# Patient Record
Sex: Female | Born: 1995 | Race: Black or African American | Hispanic: No | Marital: Married | Smoking: Never smoker
Health system: Southern US, Community
[De-identification: ages and names within clinical notes are randomized; demographics above are authoritative.]

## PROBLEM LIST (undated history)

## (undated) ENCOUNTER — Inpatient Hospital Stay (HOSPITAL_COMMUNITY): Payer: Self-pay

## (undated) DIAGNOSIS — B3731 Acute candidiasis of vulva and vagina: Secondary | ICD-10-CM

## (undated) DIAGNOSIS — A749 Chlamydial infection, unspecified: Secondary | ICD-10-CM

## (undated) DIAGNOSIS — A549 Gonococcal infection, unspecified: Secondary | ICD-10-CM

## (undated) DIAGNOSIS — N390 Urinary tract infection, site not specified: Secondary | ICD-10-CM

## (undated) DIAGNOSIS — N189 Chronic kidney disease, unspecified: Secondary | ICD-10-CM

## (undated) DIAGNOSIS — B373 Candidiasis of vulva and vagina: Secondary | ICD-10-CM

## (undated) HISTORY — PX: NO PAST SURGERIES: SHX2092

---
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
## 2013-01-01 ENCOUNTER — Encounter: Payer: Self-pay | Admitting: Family Medicine

## 2013-02-08 ENCOUNTER — Emergency Department (HOSPITAL_COMMUNITY)
Admission: EM | Admit: 2013-02-08 | Discharge: 2013-02-08 | Disposition: A | Discharge status: Home / Self Care | Payer: No Typology Code available for payment source | Attending: Emergency Medicine | Admitting: Emergency Medicine

## 2013-02-08 ENCOUNTER — Encounter (HOSPITAL_COMMUNITY): Payer: Self-pay | Admitting: Emergency Medicine

## 2013-02-08 DIAGNOSIS — M549 Dorsalgia, unspecified: Secondary | ICD-10-CM | POA: Insufficient documentation

## 2013-02-08 DIAGNOSIS — Z3202 Encounter for pregnancy test, result negative: Secondary | ICD-10-CM | POA: Insufficient documentation

## 2013-02-08 DIAGNOSIS — N760 Acute vaginitis: Secondary | ICD-10-CM | POA: Insufficient documentation

## 2013-02-08 DIAGNOSIS — N390 Urinary tract infection, site not specified: Secondary | ICD-10-CM | POA: Insufficient documentation

## 2013-02-08 DIAGNOSIS — R509 Fever, unspecified: Secondary | ICD-10-CM | POA: Insufficient documentation

## 2013-02-08 DIAGNOSIS — A499 Bacterial infection, unspecified: Secondary | ICD-10-CM | POA: Insufficient documentation

## 2013-02-08 DIAGNOSIS — B9689 Other specified bacterial agents as the cause of diseases classified elsewhere: Secondary | ICD-10-CM | POA: Insufficient documentation

## 2013-02-08 LAB — URINALYSIS, ROUTINE W REFLEX MICROSCOPIC
Protein, ur: NEGATIVE mg/dL
Urobilinogen, UA: 2 mg/dL — ABNORMAL HIGH (ref 0.0–1.0)

## 2013-02-08 LAB — URINE MICROSCOPIC-ADD ON

## 2013-02-08 LAB — WET PREP, GENITAL: Trich, Wet Prep: NONE SEEN

## 2013-02-08 MED ORDER — NITROFURANTOIN MONOHYD MACRO 100 MG PO CAPS
100.0000 mg | ORAL_CAPSULE | Freq: Once | ORAL | Status: AC
  Administered 2013-02-08: 100 mg via ORAL
  Filled 2013-02-08: qty 1

## 2013-02-08 MED ORDER — METRONIDAZOLE 0.75 % VA GEL: 1.0000 | Freq: Every day | VAGINAL | Status: DC

## 2013-02-08 MED ORDER — NITROFURANTOIN MONOHYD MACRO 100 MG PO CAPS: 100.0000 mg | ORAL_CAPSULE | Freq: Two times a day (BID) | ORAL | Status: DC

## 2013-02-08 NOTE — ED Notes (Signed)
Pt also c/o vaginal discharge with sex.

## 2013-02-08 NOTE — ED Provider Notes (Signed)
CSN: [id number]     Arrival date & time 02/08/13  1200 History   First MD Initiated Contact with Patient 02/08/13 1405     Chief Complaint  Patient presents with  . Urinary Frequency  . Dysuria  . Vaginal Discharge   (Consider location/radiation/quality/duration/timing/severity/associated sxs/prior Treatment) HPI Comments: Patient presents with complaint of vaginal discharge for several weeks, dysuria for the past day with associated increased in frequency. Patient will treatment for yeast infection this morning but has not started using. She has had subjective fever. No vomiting. Occasional back pain. No diarrhea or abdominal pain. Onset of symptoms gradual. Course is constant. Nothing makes symptoms better or worse. She is sexually active and uses protection. She does not think that she has a STD.  Patient is a 17 y.o. female presenting with frequency, dysuria, and vaginal discharge. The history is provided by the patient.  Urinary Frequency Associated symptoms include a fever (subjective). Pertinent negatives include no abdominal pain, chest pain, coughing, headaches, myalgias, nausea, rash, sore throat or vomiting.  Dysuria Associated symptoms: fever (subjective) and vaginal discharge   Associated symptoms: no abdominal pain, no nausea and no vomiting   Vaginal Discharge Associated symptoms: dysuria and fever (subjective)   Associated symptoms: no abdominal pain, no nausea and no vomiting     Past Medical History  Diagnosis Date  . GC (gonococcus infection)   . Chlamydia   . Yeast vaginitis    History reviewed. No pertinent past surgical history. No family history on file. History  Substance Use Topics  . Smoking status: Never Smoker   . Smokeless tobacco: Never Used  . Alcohol Use: No   OB History   Grav Para Term Preterm Abortions TAB SAB Ect Mult Living                 Review of Systems  Constitutional: Positive for fever (subjective).  HENT: Negative for  rhinorrhea and sore throat.   Eyes: Negative for redness.  Respiratory: Negative for cough.   Cardiovascular: Negative for chest pain.  Gastrointestinal: Negative for nausea, vomiting, abdominal pain and diarrhea.  Genitourinary: Positive for dysuria, frequency and vaginal discharge.  Musculoskeletal: Positive for back pain. Negative for myalgias.  Skin: Negative for rash.  Neurological: Negative for headaches.    Allergies  Flagyl  Home Medications   Current Outpatient Rx  Name  Route  Sig  Dispense  Refill  . miconazole (MONISTAT 7) 2 % vaginal cream   Vaginal   Place 1 Applicatorful vaginally daily.          BP 116/78  Pulse 103  Temp(Src) 100 F (37.8 C) (Oral)  Resp 18  Wt 119 lb (53.978 kg)  SpO2 100% Physical Exam  Nursing note and vitals reviewed. Constitutional: She appears well-developed and well-nourished.  HENT:  Head: Normocephalic and atraumatic.  Eyes: Conjunctivae are normal. Right eye exhibits no discharge. Left eye exhibits no discharge.  Neck: Normal range of motion. Neck supple.  Cardiovascular: Normal rate, regular rhythm and normal heart sounds.   Pulmonary/Chest: Effort normal and breath sounds normal.  Abdominal: Soft. There is tenderness (mild) in the suprapubic area. There is no rigidity, no rebound, no guarding, no CVA tenderness, no tenderness at McBurney's point and negative Murphy's sign.  Genitourinary: Cervix exhibits no motion tenderness, no discharge and no friability. Right adnexum displays no mass, no tenderness and no fullness. Left adnexum displays no mass, no tenderness and no fullness. No erythema, tenderness or bleeding around the vagina. Vaginal discharge (  thick white) found.  Neurological: She is alert.  Skin: Skin is warm and dry.  Psychiatric: She has a normal mood and affect.    ED Course  Procedures (including critical care time) Labs Review Labs Reviewed  WET PREP, GENITAL - Abnormal; Notable for the following:     Clue Cells Wet Prep HPF POC FEW (*)    WBC, Wet Prep HPF POC FEW (*)    All other components within normal limits  URINALYSIS, ROUTINE W REFLEX MICROSCOPIC - Abnormal; Notable for the following:    APPearance TURBID (*)    Hgb urine dipstick LARGE (*)    Bilirubin Urine SMALL (*)    Ketones, ur 40 (*)    Urobilinogen, UA 2.0 (*)    Leukocytes, UA LARGE (*)    All other components within normal limits  URINE MICROSCOPIC-ADD ON - Abnormal; Notable for the following:    Squamous Epithelial / LPF FEW (*)    Bacteria, UA MANY (*)    All other components within normal limits  URINE CULTURE  GC/CHLAMYDIA PROBE AMP  POCT PREGNANCY, URINE   Imaging Review No results found.  EKG Interpretation   None      2:20 PM Patient seen and examined. Work-up initiated. Medications ordered.   Vital signs reviewed and are as follows: Filed Vitals:   02/08/13 1404  BP: 116/78  Pulse: 103  Temp: 100 F (37.8 C)  Resp: 18   Pelvic performed with nurse chaperone.   Pt informed of all results.   Patient urged to return with worsening symptoms or other concerns. Patient verbalized understanding and agrees with plan.     MDM   1. Urinary tract infection   2. Bacterial vaginosis    Dysuria: uncomplicated cystitis, macrobid given. No clinical pyelo. Neg UPT.   Vaginal d/c: No yeast, mild clue cells. Will treat for BV. Test for GC/chlamydia sent. Pt refuses empiric treatment.     Renne Crigler, PA-C 02/08/13 1945

## 2013-02-08 NOTE — ED Notes (Signed)
Pt states she woke this morning and had burning/frequent urination.  Pt also states she has white vaginal discharge.

## 2013-02-10 ENCOUNTER — Telehealth (HOSPITAL_COMMUNITY): Payer: Self-pay | Admitting: *Deleted

## 2013-02-10 LAB — URINE CULTURE: Colony Count: >=100000

## 2013-02-10 NOTE — Progress Notes (Signed)
ED Antimicrobial Stewardship Positive Culture Follow Up   Ethelean Faughnan is an 17 y.o. female who presented to [facility] on 02/08/2013 with a chief complaint of  Chief Complaint  Patient presents with  . Urinary Frequency  . Dysuria  . Vaginal Discharge    Recent Results (from the past 720 hour(s))  URINE CULTURE     Status: None   Collection Time    02/08/13 12:57 PM      Result Value Range Status   Specimen Description URINE, CLEAN CATCH   Final   Special Requests NONE   Final   Culture  Setup Time     Final   Value: 02/08/2013 17:13     Performed at [facility] Count     Final   Value: >=100,000 COLONIES/ML     Performed at [facility]   Culture     Final   Value: PROTEUS MIRABILIS     Performed at [facility]   Report Status 02/10/2013 FINAL   Final   Organism ID, Bacteria PROTEUS MIRABILIS   Final  GC/CHLAMYDIA PROBE AMP     Status: None   Collection Time    02/08/13  3:02 PM      Result Value Range Status   CT Probe RNA NEGATIVE  NEGATIVE Final   GC Probe RNA NEGATIVE  NEGATIVE Final   Comment: (NOTE)                                                                                               **Normal Reference Range: Negative**          Assay performed using the Gen-Probe APTIMA COMBO2 (R) Assay.     Acceptable specimen types for this assay include APTIMA Swabs (Unisex,     endocervical, urethral, or vaginal), first void urine, and ThinPrep     liquid based cytology samples.     Performed at [facility]  WET PREP, GENITAL     Status: Abnormal   Collection Time    02/08/13  3:03 PM      Result Value Range Status   Yeast Wet Prep HPF POC NONE SEEN  NONE SEEN Corrected   Comment: CALLEDY BY DR Fayrene Fearing @ 1547 02/08/13 A BROWNING     CORRECTED ON 12/29 AT 1946: PREVIOUSLY REPORTED AS NO CHARGE   Trich, Wet Prep NONE SEEN  NONE SEEN Final   Clue Cells Wet Prep HPF POC FEW (*) NONE SEEN Final   WBC, Wet Prep HPF POC FEW  (*) NONE SEEN Final    [x]  Treated with nitrofurantoin, organism resistant to prescribed antimicrobial []  Patient discharged originally without antimicrobial agent and treatment is now indicated  New antibiotic prescription: Bactrim DS 1 tablet BID x 3 days  ED Provider: Francee Piccolo, PA-C   Mickeal Skinner 02/10/2013, 9:54 AM Infectious Diseases Pharmacist Phone# [phone]

## 2013-02-10 NOTE — ED Provider Notes (Signed)
Medical screening examination/treatment/procedure(s) were performed by non-physician practitioner and as supervising physician I was immediately available for consultation/collaboration.  EKG Interpretation   None         Rolland Porter, MD 02/10/13 [phone]

## 2013-02-10 NOTE — ED Notes (Signed)
Post ED Visit - Positive Culture Follow-up: Successful Patient Follow-Up  Culture assessed and recommendations reviewed by: []  Wes Dulaney, Pharm.D., BCPS [x]  Celedonio Miyamoto, Pharm.D., BCPS []  Georgina Pillion, [address].D., BCPS []  [place], [address].D., BCPS, AAHIVP []  Estella Husk, Pharm.D., BCPS, AAHIVP  Positive urine culture [X]  Treated with nitrofurantoin, organism resistant to prescribed antimicrobial  [ ]  Patient discharged originally without antimicrobial agent and treatment is now indicated  New antibiotic prescription: Bactrim DS 1 tablet BID x 3 days  ED Provider: Francee Piccolo, PA-C       Larena Sox 02/10/2013, 1:57 PM

## 2013-02-11 NOTE — L&D Delivery Note (Signed)
Delivery Note At 10:36 AM a viable and healthy female was delivered via Vaginal, Spontaneous Delivery (Presentation: Right Occiput Anterior).  APGAR: 9, 9; weight 7 lb 10.9 oz (3485 g).   Placenta status: Intact, Spontaneous.  Cord: 3 vessels with the following complications: None.    Anesthesia: Epidural  Episiotomy: None Lacerations: 2nd degree Suture Repair: 3.0 vicryl Est. Blood Loss (mL): 350  Mom to postpartum.  Baby to Couplet care / Skin to Skin.  Robin Craig, Robin Safley L  MD, PGY1 01/20/2014, 11:43 AM

## 2013-08-05 LAB — OB RESULTS CONSOLE GBS: GBS: POSITIVE

## 2013-08-05 LAB — OB RESULTS CONSOLE GC/CHLAMYDIA
Chlamydia: NEGATIVE
GC PROBE AMP, GENITAL: NEGATIVE

## 2013-08-05 LAB — OB RESULTS CONSOLE RUBELLA ANTIBODY, IGM: Rubella: IMMUNE

## 2013-08-05 LAB — OB RESULTS CONSOLE HEPATITIS B SURFACE ANTIGEN: HEP B S AG: NEGATIVE

## 2013-08-05 LAB — OB RESULTS CONSOLE HIV ANTIBODY (ROUTINE TESTING): HIV: NONREACTIVE

## 2013-08-05 LAB — OB RESULTS CONSOLE RPR: RPR: NONREACTIVE

## 2014-01-19 ENCOUNTER — Encounter (HOSPITAL_COMMUNITY): Payer: Self-pay | Admitting: *Deleted

## 2014-01-19 ENCOUNTER — Inpatient Hospital Stay (HOSPITAL_COMMUNITY): Admitting: Anesthesiology

## 2014-01-19 ENCOUNTER — Inpatient Hospital Stay (HOSPITAL_COMMUNITY)

## 2014-01-19 ENCOUNTER — Inpatient Hospital Stay (HOSPITAL_COMMUNITY)
Admission: AD | Admit: 2014-01-19 | Discharge: 2014-01-22 | DRG: 774 | Disposition: A | Discharge status: Home / Self Care | Source: Ambulatory Visit | Attending: Obstetrics & Gynecology | Admitting: Obstetrics & Gynecology

## 2014-01-19 DIAGNOSIS — Z3A39 39 weeks gestation of pregnancy: Secondary | ICD-10-CM | POA: Diagnosis present

## 2014-01-19 DIAGNOSIS — O99824 Streptococcus B carrier state complicating childbirth: Secondary | ICD-10-CM | POA: Diagnosis present

## 2014-01-19 DIAGNOSIS — IMO0002 Reserved for concepts with insufficient information to code with codable children: Secondary | ICD-10-CM

## 2014-01-19 LAB — DIFFERENTIAL
Basophils Absolute: 0 10*3/uL (ref 0.0–0.1)
Basophils Relative: 0 % (ref 0–1)
Eosinophils Absolute: 0 10*3/uL (ref 0.0–0.7)
Eosinophils Relative: 0 % (ref 0–5)
LYMPHS ABS: 2 10*3/uL (ref 0.7–4.0)
LYMPHS PCT: 13 % (ref 12–46)
MONO ABS: 1.7 10*3/uL — AB (ref 0.1–1.0)
Monocytes Relative: 11 % (ref 3–12)
NEUTROS ABS: 11.3 10*3/uL — AB (ref 1.7–7.7)
Neutrophils Relative %: 76 % (ref 43–77)

## 2014-01-19 LAB — CBC
HEMATOCRIT: 35.2 % — AB (ref 36.0–46.0)
HEMOGLOBIN: 11.7 g/dL — AB (ref 12.0–15.0)
MCH: 28.5 pg (ref 26.0–34.0)
MCHC: 33.2 g/dL (ref 30.0–36.0)
MCV: 85.6 fL (ref 78.0–100.0)
Platelets: 218 10*3/uL (ref 150–400)
RBC: 4.11 MIL/uL (ref 3.87–5.11)
RDW: 15.1 % (ref 11.5–15.5)
WBC: 15.1 10*3/uL — AB (ref 4.0–10.5)

## 2014-01-19 LAB — TYPE AND SCREEN
ABO/RH(D): B POS
Antibody Screen: NEGATIVE

## 2014-01-19 MED ORDER — FENTANYL CITRATE 0.05 MG/ML IJ SOLN: 50.0000 ug | INTRAMUSCULAR | Status: DC | PRN

## 2014-01-19 MED ORDER — EPHEDRINE 5 MG/ML INJ
10.0000 mg | INTRAVENOUS | Status: DC | PRN
  Filled 2014-01-19: qty 2

## 2014-01-19 MED ORDER — FENTANYL CITRATE 0.05 MG/ML IJ SOLN
100.0000 ug | INTRAMUSCULAR | Status: DC | PRN
  Administered 2014-01-19: 100 ug via INTRAVENOUS
  Filled 2014-01-19: qty 2

## 2014-01-19 MED ORDER — LIDOCAINE HCL (PF) 1 % IJ SOLN
INTRAMUSCULAR | Status: DC | PRN
  Administered 2014-01-19: 7 mL
  Administered 2014-01-19: 9 mL

## 2014-01-19 MED ORDER — OXYCODONE-ACETAMINOPHEN 5-325 MG PO TABS: 2.0000 | ORAL_TABLET | ORAL | Status: DC | PRN

## 2014-01-19 MED ORDER — FENTANYL 2.5 MCG/ML BUPIVACAINE 1/10 % EPIDURAL INFUSION (WH - ANES)
INTRAMUSCULAR | Status: DC | PRN
  Administered 2014-01-19: 14 mL/h via EPIDURAL

## 2014-01-19 MED ORDER — FLEET ENEMA 7-19 GM/118ML RE ENEM
1.0000 | ENEMA | RECTAL | Status: DC | PRN
Start: 2014-01-19 — End: 2014-01-20

## 2014-01-19 MED ORDER — OXYCODONE-ACETAMINOPHEN 5-325 MG PO TABS: 1.0000 | ORAL_TABLET | ORAL | Status: DC | PRN

## 2014-01-19 MED ORDER — OXYTOCIN 40 UNITS IN LACTATED RINGERS INFUSION - SIMPLE MED
62.5000 mL/h | INTRAVENOUS | Status: DC
  Filled 2014-01-19: qty 1000

## 2014-01-19 MED ORDER — LACTATED RINGERS IV SOLN
500.0000 mL | INTRAVENOUS | Status: DC | PRN
  Administered 2014-01-20: 500 mL via INTRAVENOUS

## 2014-01-19 MED ORDER — PHENYLEPHRINE 40 MCG/ML (10ML) SYRINGE FOR IV PUSH (FOR BLOOD PRESSURE SUPPORT)
80.0000 ug | PREFILLED_SYRINGE | INTRAVENOUS | Status: DC | PRN
  Filled 2014-01-19: qty 2

## 2014-01-19 MED ORDER — LACTATED RINGERS IV SOLN
INTRAVENOUS | Status: DC
  Administered 2014-01-19: 22:00:00 via INTRAVENOUS
  Administered 2014-01-20: 125 mL/h via INTRAVENOUS

## 2014-01-19 MED ORDER — CITRIC ACID-SODIUM CITRATE 334-500 MG/5ML PO SOLN: 30.0000 mL | ORAL | Status: DC | PRN

## 2014-01-19 MED ORDER — FENTANYL 2.5 MCG/ML BUPIVACAINE 1/10 % EPIDURAL INFUSION (WH - ANES)
14.0000 mL/h | INTRAMUSCULAR | Status: DC | PRN
  Administered 2014-01-19 – 2014-01-20 (×2): 14 mL/h via EPIDURAL
  Filled 2014-01-19 (×2): qty 125

## 2014-01-19 MED ORDER — PENICILLIN G POTASSIUM 5000000 UNITS IJ SOLR
2.5000 10*6.[IU] | INTRAVENOUS | Status: DC
  Administered 2014-01-20 (×3): 2.5 10*6.[IU] via INTRAVENOUS
  Filled 2014-01-19 (×5): qty 2.5

## 2014-01-19 MED ORDER — ACETAMINOPHEN 325 MG PO TABS
650.0000 mg | ORAL_TABLET | ORAL | Status: DC | PRN
  Administered 2014-01-20: 650 mg via ORAL
  Filled 2014-01-19: qty 2

## 2014-01-19 MED ORDER — DIPHENHYDRAMINE HCL 50 MG/ML IJ SOLN: 12.5000 mg | INTRAMUSCULAR | Status: DC | PRN

## 2014-01-19 MED ORDER — LACTATED RINGERS IV SOLN
500.0000 mL | Freq: Once | INTRAVENOUS | Status: AC
  Administered 2014-01-19: 500 mL via INTRAVENOUS

## 2014-01-19 MED ORDER — PHENYLEPHRINE 40 MCG/ML (10ML) SYRINGE FOR IV PUSH (FOR BLOOD PRESSURE SUPPORT)
80.0000 ug | PREFILLED_SYRINGE | INTRAVENOUS | Status: DC | PRN
  Filled 2014-01-19: qty 10
  Filled 2014-01-19: qty 2

## 2014-01-19 MED ORDER — ONDANSETRON HCL 4 MG/2ML IJ SOLN
4.0000 mg | Freq: Four times a day (QID) | INTRAMUSCULAR | Status: DC | PRN
Start: 2014-01-19 — End: 2014-01-20
  Administered 2014-01-20: 4 mg via INTRAVENOUS
  Filled 2014-01-19: qty 2

## 2014-01-19 MED ORDER — PENICILLIN G POTASSIUM 5000000 UNITS IJ SOLR
5.0000 10*6.[IU] | Freq: Once | INTRAMUSCULAR | Status: AC
  Administered 2014-01-19: 5 10*6.[IU] via INTRAVENOUS
  Filled 2014-01-19: qty 5

## 2014-01-19 MED ORDER — OXYTOCIN BOLUS FROM INFUSION: 500.0000 mL | INTRAVENOUS | Status: DC

## 2014-01-19 MED ORDER — LIDOCAINE HCL (PF) 1 % IJ SOLN
30.0000 mL | INTRAMUSCULAR | Status: DC | PRN
  Filled 2014-01-19: qty 30

## 2014-01-19 NOTE — H&P (Signed)
Robin Craig is a 18 y.o. female G1P0 at 4054w1d by unknown dating criteria as prenatal records not available presenting for symptoms of labor.  [place] in [place] and denies any pregnancy complications. Planned to deliver here as her FOB just got deployed and [place]. Reports frequent contractions (every 3-5 minutes), possible LOF, no vaginal bleeding. Good fetal movement.   Maternal Medical History:  Reason for admission: Nausea.    OB History    Gravida Para Term Preterm AB TAB SAB Ectopic Multiple Living   1              Past Medical History  Diagnosis Date  . GC (gonococcus infection)   . Chlamydia   . Yeast vaginitis    Past Surgical History  Procedure Laterality Date  . No past surgeries    . Wisdom tooth extraction  2010   Family History: family history is negative for Cancer, Diabetes, and Hypertension. Social History:  reports that she has never smoked. She has never used smokeless tobacco. She reports that she does not drink alcohol or use illicit drugs.    Review of Systems  Constitutional: Negative for fever, chills and weight loss.  Eyes: Negative for blurred vision.  Respiratory: Negative for cough and shortness of breath.   Cardiovascular: Negative for chest pain and leg swelling.  Gastrointestinal: Negative for heartburn, nausea, vomiting and abdominal pain.  Genitourinary: Negative for dysuria.  Musculoskeletal: Positive for back pain.  Skin: Negative for itching.  Neurological: Negative for headaches.     1900 Patient checked in triage room 1.5/100/-2/BBOW/cephlaic (verified on ultrasound).  Dilation: 3 Effacement (%): 90 Station: -2 Exam by:: Restaurant manager, fast foodDunbar RN  Blood pressure 106/62, pulse 80, temperature 98.6 F (37 C), temperature source Oral, resp. rate 18, height 5\' 5"  (1.651 m), weight 70.308 kg (155 lb), SpO2 92 %. Maternal Exam:  Uterine Assessment: Contraction strength is moderate.  Contraction frequency is  irregular.   Pelvis: adequate for delivery.   Cervix: Cervix evaluated by digital exam.     Fetal Exam Fetal Monitor Review: Baseline rate: 135.  Variability: moderate (6-25 bpm).   Pattern: accelerations present and no decelerations.    Fetal State Assessment: Category I - tracings are normal.    Fetal heart monitoring: noted to have recurrent variables in MAU with good variability and accels present in between   Physical Exam  Constitutional: She is oriented to person, place, and time. She appears well-developed and well-nourished. No distress.  HENT:  Head: Normocephalic and atraumatic.  Cardiovascular: Normal rate and intact distal pulses.   Respiratory: Effort normal. No respiratory distress.  Musculoskeletal: She exhibits no edema or tenderness.  Neurological: She is alert and oriented to person, place, and time.  Skin: Skin is warm and dry.    Prenatal labs: ABO, Rh: --/--/B POS (12/09 2001) Antibody: NEG (12/09 2001) Rubella:  pending RPR:   pending HBsAg:   pending HIV:   pending GBS:   positive per patient report, PCR pending  Assessment/Plan: Robin Craig is a 18 y.o. G1P0 at 4654w1d here for symptoms of labor. Cervix change from 1.5 to 3cm dilation within 1 hour.    #Labor: expectant management currently. May require augmentation with pitocin later #Pain: IV Fentanyl, epidural upon request #FWB: Cat 1 currently.  Had recurrent variables in MAU, given cervical change will cancel BPP and proceed with admission to L&D. #ID:  GBS pos per patient report - PCN. Repeat PCR pending #MOF: bottle #MOC:  uncertain #Circ:  Desires IP circ    Shirlee LatchBacigalupo, Angela 01/19/2014, 10:20 PM    Attestation of Attending Supervision of Resident: Evaluation and management procedures were performed by the [facility] Resident under my supervision.  I have seen and examined the patient, reviewed the resident's note and chart, and I agree with the management and plan.  Jaynie CollinsUGONNA   Sunni Richardson, MD, FACOG Attending Obstetrician & Gynecologist Faculty Practice, [facility]

## 2014-01-19 NOTE — MAU Note (Signed)
Pt in room 172, Labor nurse in room. Pt ambulating to BR, will apply monitors.

## 2014-01-19 NOTE — MAU Note (Signed)
Started having uc's @ 0500 yesterday, [place] husband is [place].  Denies bleeding or LOF.  Feels need to push.

## 2014-01-19 NOTE — MAU Provider Note (Signed)
Obstetric Attending MAU Note  Chief Complaint:  Labor Eval   None    HPI: Robin Craig is a 18 y.o. G1P0 at 6453w1d who presents to maternity admissions reporting feeling urge to push. [place] in [place] and denies any pregnancy complications.  Planned to deliver here as her FOB just got deployed and [place] in [place].  Reports frequent contractions (every 3-5 minutes), possible LOF, no vaginal bleeding. Good fetal movement.   Past Medical History  Diagnosis Date  . GC (gonococcus infection)   . Chlamydia   . Yeast vaginitis     OB History  Gravida Para Term Preterm AB SAB TAB Ectopic Multiple Living  1             # Outcome Date GA Lbr Len/2nd Weight Sex Delivery Anes PTL Lv  1 Current               No past surgical history on file.  Family History: No family history on file.  Social History: History  Substance Use Topics  . Smoking status: Never Smoker   . Smokeless tobacco: Never Used  . Alcohol Use: No    Allergies:  Allergies  Allergen Reactions  . Flagyl [Metronidazole] Hives    Tablets only; can use cream    Prescriptions prior to admission  Medication Sig Dispense Refill Last Dose  . metroNIDAZOLE (METROGEL) 0.75 % vaginal gel Place 1 Applicatorful vaginally at bedtime. Use for 5 days. 70 g 0   . miconazole (MONISTAT 7) 2 % vaginal cream Place 1 Applicatorful vaginally daily.   02/07/2013 at Unknown time  . nitrofurantoin, macrocrystal-monohydrate, (MACROBID) 100 MG capsule Take 1 capsule (100 mg total) by mouth 2 (two) times daily. 10 capsule 0     ROS: Pertinent findings in history of present illness.  Physical Exam  Blood pressure 118/89, pulse 92, temperature 98.3 F (36.8 C), temperature source Oral, resp. rate 20. GENERAL: Well-developed, well-nourished female in no acute distress.  HEENT: Normocephalic, atraumatic HEART: Regular rate and regular RESP: Normal effort, no breathing difficult ABDOMEN: Soft, non-tender,  gravid appropriate for gestational age EXTREMITIES: Nontender, no edema NEURO: Alert and oriented SPECULUM EXAM: NEFG, physiologic discharge, no blood, cervix clean Dilation: 1.5 Effacement (%): 100 Presentation: Vertex (Verified by sonosite U/S) Exam by:: Dr. Macon LargeAnyanwu at 1900  FHT:  Baseline 160 , moderate variability, accelerations present, no decelerations Contractions: q 3-4 mins   Labs: No results found for this or any previous visit (from the past 24 hour(s)).  Imaging:  No results found.  MAU Course: 1900 Patient checked in triage room 1.5/100/-2/BBOW/cephlaic (verified on ultrasound). Will observe and reevaluate in about one hour. Care Everywhere checked, no prenatal labs seen.  Prenatal labs ordered including rapid GBS.  Fentanyl IM ordered prn pain.  Assessment: Likely early labor  Plan: Will follow up to see if she makes cervical change, continue close monitoring.    Tereso NewcomerUgonna A Dylynn Ketner, MD 01/19/2014 7:35 PM

## 2014-01-19 NOTE — MAU Note (Signed)
Pt care assumed from [place], [place]

## 2014-01-19 NOTE — Anesthesia Preprocedure Evaluation (Signed)
Anesthesia Evaluation  Patient identified by MRN, date of birth, ID band Patient awake    Reviewed: Allergy & Precautions, H&P , NPO status , Patient's Chart, lab work & pertinent test results  Airway Mallampati: I TM Distance: >3 FB Neck ROM: full    Dental no notable dental hx.    Pulmonary    Pulmonary exam normal       Cardiovascular negative cardio ROS      Neuro/Psych negative neurological ROS  negative psych ROS   GI/Hepatic negative GI ROS, Neg liver ROS,   Endo/Other  negative endocrine ROS  Renal/GU negative Renal ROS     Musculoskeletal   Abdominal Normal abdominal exam  (+)   Peds  Hematology negative hematology ROS (+)   Anesthesia Other Findings   Reproductive/Obstetrics (+) Pregnancy                           Anesthesia Physical Anesthesia Plan  ASA: II  Anesthesia Plan: Epidural   Post-op Pain Management:    Induction:   Airway Management Planned:   Additional Equipment:   Intra-op Plan:   Post-operative Plan:   Informed Consent: I have reviewed the patients History and Physical, chart, labs and discussed the procedure including the risks, benefits and alternatives for the proposed anesthesia with the patient or authorized representative who has indicated his/her understanding and acceptance.     Plan Discussed with:   Anesthesia Plan Comments:         Anesthesia Quick Evaluation  

## 2014-01-19 NOTE — Anesthesia Procedure Notes (Signed)
Epidural Patient location during procedure: OB Start time: 01/19/2014 10:03 PM End time: 01/19/2014 10:07 PM  Staffing Anesthesiologist: Leilani AbleHATCHETT, Noemi Bellissimo  Preanesthetic Checklist Completed: patient identified, surgical consent, pre-op evaluation, timeout performed, IV checked, risks and benefits discussed and monitors and equipment checked  Epidural Patient position: sitting Prep: site prepped and draped and DuraPrep Patient monitoring: continuous pulse ox and blood pressure Approach: midline Location: L3-L4 Injection technique: LOR air  Needle:  Needle type: Tuohy  Needle gauge: 17 G Needle length: 9 cm and 9 Needle insertion depth: 5 cm cm Catheter type: closed end flexible Catheter size: 19 Gauge Catheter at skin depth: 10 cm Test dose: negative and Other  Assessment Sensory level: T9 Events: blood not aspirated, injection not painful, no injection resistance, negative IV test and no paresthesia  Additional Notes Reason for block:procedure for pain

## 2014-01-20 ENCOUNTER — Encounter (HOSPITAL_COMMUNITY): Payer: Self-pay | Admitting: *Deleted

## 2014-01-20 LAB — HEPATITIS B SURFACE ANTIGEN: Hepatitis B Surface Ag: NEGATIVE

## 2014-01-20 LAB — RUBELLA SCREEN: Rubella: 3.23 Index — ABNORMAL HIGH (ref <0.90)

## 2014-01-20 LAB — HIV ANTIBODY (ROUTINE TESTING W REFLEX): HIV: NONREACTIVE

## 2014-01-20 LAB — ABO/RH: ABO/RH(D): B POS

## 2014-01-20 LAB — RPR

## 2014-01-20 MED ORDER — OXYCODONE-ACETAMINOPHEN 5-325 MG PO TABS: 1.0000 | ORAL_TABLET | ORAL | Status: DC | PRN

## 2014-01-20 MED ORDER — SENNOSIDES-DOCUSATE SODIUM 8.6-50 MG PO TABS
2.0000 | ORAL_TABLET | ORAL | Status: DC
Start: 2014-01-21 — End: 2014-01-22
  Administered 2014-01-21 – 2014-01-22 (×2): 2 via ORAL
  Filled 2014-01-20 (×2): qty 2

## 2014-01-20 MED ORDER — WITCH HAZEL-GLYCERIN EX PADS: 1.0000 "application " | MEDICATED_PAD | CUTANEOUS | Status: DC | PRN

## 2014-01-20 MED ORDER — DIBUCAINE 1 % RE OINT: 1.0000 "application " | TOPICAL_OINTMENT | RECTAL | Status: DC | PRN

## 2014-01-20 MED ORDER — BENZOCAINE-MENTHOL 20-0.5 % EX AERO: 1.0000 "application " | INHALATION_SPRAY | CUTANEOUS | Status: DC | PRN

## 2014-01-20 MED ORDER — TERBUTALINE SULFATE 1 MG/ML IJ SOLN
INTRAMUSCULAR | Status: AC
  Administered 2014-01-20: 0.25 mg via SUBCUTANEOUS
  Filled 2014-01-20: qty 1

## 2014-01-20 MED ORDER — LANOLIN HYDROUS EX OINT: TOPICAL_OINTMENT | CUTANEOUS | Status: DC | PRN

## 2014-01-20 MED ORDER — ONDANSETRON HCL 4 MG/2ML IJ SOLN: 4.0000 mg | INTRAMUSCULAR | Status: DC | PRN

## 2014-01-20 MED ORDER — ONDANSETRON HCL 4 MG PO TABS: 4.0000 mg | ORAL_TABLET | ORAL | Status: DC | PRN

## 2014-01-20 MED ORDER — TETANUS-DIPHTH-ACELL PERTUSSIS 5-2.5-18.5 LF-MCG/0.5 IM SUSP: 0.5000 mL | Freq: Once | INTRAMUSCULAR | Status: DC

## 2014-01-20 MED ORDER — ZOLPIDEM TARTRATE 5 MG PO TABS: 5.0000 mg | ORAL_TABLET | Freq: Every evening | ORAL | Status: DC | PRN

## 2014-01-20 MED ORDER — TERBUTALINE SULFATE 1 MG/ML IJ SOLN
0.2500 mg | Freq: Once | INTRAMUSCULAR | Status: AC
  Administered 2014-01-20: 0.25 mg via SUBCUTANEOUS

## 2014-01-20 MED ORDER — IBUPROFEN 600 MG PO TABS
600.0000 mg | ORAL_TABLET | Freq: Four times a day (QID) | ORAL | Status: DC
  Administered 2014-01-20 – 2014-01-22 (×8): 600 mg via ORAL
  Filled 2014-01-20 (×8): qty 1

## 2014-01-20 MED ORDER — INFLUENZA VAC SPLIT QUAD 0.5 ML IM SUSY: 0.5000 mL | PREFILLED_SYRINGE | INTRAMUSCULAR | Status: DC

## 2014-01-20 MED ORDER — OXYCODONE-ACETAMINOPHEN 5-325 MG PO TABS: 2.0000 | ORAL_TABLET | ORAL | Status: DC | PRN

## 2014-01-20 MED ORDER — SIMETHICONE 80 MG PO CHEW: 80.0000 mg | CHEWABLE_TABLET | ORAL | Status: DC | PRN

## 2014-01-20 MED ORDER — PRENATAL MULTIVITAMIN CH
1.0000 | ORAL_TABLET | Freq: Every day | ORAL | Status: DC
  Administered 2014-01-21: 1 via ORAL
  Filled 2014-01-20: qty 1

## 2014-01-20 MED ORDER — DIPHENHYDRAMINE HCL 25 MG PO CAPS: 25.0000 mg | ORAL_CAPSULE | Freq: Four times a day (QID) | ORAL | Status: DC | PRN

## 2014-01-20 NOTE — Anesthesia Postprocedure Evaluation (Signed)
  Anesthesia Post-op Note  Patient: Robin Craig  Procedure(s) Performed: * No procedures listed *  Patient Location: Mother/Baby  Anesthesia Type:Epidural  Level of Consciousness: awake  Airway and Oxygen Therapy: Patient Spontanous Breathing  Post-op Pain: mild  Post-op Assessment: Patient's Cardiovascular Status Stable and Respiratory Function Stable  Post-op Vital Signs: stable  Last Vitals:  Filed Vitals:   01/20/14 1357  BP: 109/66  Pulse: 84  Temp:   Resp: 20    Complications: No apparent anesthesia complications

## 2014-01-20 NOTE — Plan of Care (Signed)
Problem: Phase I Progression Outcomes Goal: VS, stable, temp < 100.4 degrees F Outcome: Completed/Met Date Met:  01/20/14 Goal: Initial discharge plan identified Outcome: Completed/Met Date Met:  01/20/14  Problem: Phase II Progression Outcomes Goal: Tolerating diet Outcome: Completed/Met Date Met:  01/20/14

## 2014-01-20 NOTE — Progress Notes (Signed)
Patient ID: Robin Craig, female   DOB: 07-02-1995, 18 y.o.   MRN: [id number]  CTSP for deep FHR variables.  Pt has been spontaneously laboring during the night and has made excellent progress. FHR variables have mostly been nonexistent once on L&D until now. Baseline is 130s with +accels, and decels are less than 60sec with nadir 60-90 with most ctx, and spontaneous recovery. Position changes, IVF bolus all employed. Cx now complete/0 station. Will give a prn Terb order if variables persist. Otherwise will labor down for now.  Cam HaiSHAW, KIMBERLY 01/20/2014 [id number]

## 2014-01-20 NOTE — Progress Notes (Signed)
Patient ID: Robin Craig, female   DOB: 1995/03/05, 18 y.o.   MRN: [id number]  Pt now at +1 station and feeling more pressure since epidural was adjusted. FHR with occ variables and also +accels. Is beginning to push and seems to be making progress. Anticipate SVD V Smith CNM updated.  Cam HaiSHAW, KIMBERLY 01/20/2014 9:04 AM

## 2014-01-20 NOTE — Plan of Care (Signed)
Problem: Phase I Progression Outcomes Goal: Pain controlled with appropriate interventions Outcome: Completed/Met Date Met:  01/20/14 Goal: Voiding adequately Outcome: Completed/Met Date Met:  01/20/14 Goal: OOB as tolerated unless otherwise ordered Outcome: Completed/Met Date Met:  01/20/14 With stedy 1700 right leg still numb

## 2014-01-21 LAB — CBC
HCT: 29.5 % — ABNORMAL LOW (ref 36.0–46.0)
HEMOGLOBIN: 9.8 g/dL — AB (ref 12.0–15.0)
MCH: 28.4 pg (ref 26.0–34.0)
MCHC: 33.2 g/dL (ref 30.0–36.0)
MCV: 85.5 fL (ref 78.0–100.0)
Platelets: 180 10*3/uL (ref 150–400)
RBC: 3.45 MIL/uL — AB (ref 3.87–5.11)
RDW: 15.4 % (ref 11.5–15.5)
WBC: 20.8 10*3/uL — ABNORMAL HIGH (ref 4.0–10.5)

## 2014-01-21 NOTE — Lactation Note (Signed)
This note was copied from the chart of Robin Craig. Lactation Consultation Note Mom BF right after delivery and didn't like BF and is going to formula bottle feed.  Patient Name: Robin Craig ZOXWR'UToday's Date: 01/21/2014 Reason for consult: Initial assessment   Maternal Data Has patient been taught Hand Expression?: No Does the patient have breastfeeding experience prior to this delivery?: No  Feeding Feeding Type: Formula Nipple Type: Regular  LATCH Score/Interventions                      Lactation Tools Discussed/Used     Consult Status Consult Status: Complete Date: 01/21/14 Follow-up type: In-patient    Charyl DancerCARVER, [name] 01/21/2014, 6:29 AM

## 2014-01-21 NOTE — Progress Notes (Signed)
Post Partum Day 1 Subjective: no complaints, up ad lib, voiding, tolerating PO and + flatus  Objective: Blood pressure 102/58, pulse 74, temperature 97.5 F (36.4 C), temperature source Oral, resp. rate 18, height 5\' 5"  (1.651 m), weight 70.308 kg (155 lb), SpO2 100 %, unknown if currently breastfeeding.  Physical Exam:  General: alert, cooperative, appears stated age and no distress Lochia: appropriate Uterine Fundus: firm Incision: N/A, Laceration repair healing well, no pain.   DVT Evaluation: No evidence of DVT seen on physical exam. Negative Homan's sign.   Recent Labs  01/19/14 2001 01/21/14 0645  HGB 11.7* 9.8*  HCT 35.2* 29.5*    Assessment/Plan: Plan for discharge tomorrow and Social Work consult   LOS: 2 days   Benjamin Stainhompson, Demaria Deeney L 01/21/2014, 8:58 AM

## 2014-01-22 MED ORDER — IBUPROFEN 600 MG PO TABS: 600.0000 mg | ORAL_TABLET | Freq: Four times a day (QID) | ORAL | Status: DC

## 2014-01-22 NOTE — Discharge Summary (Signed)
Obstetric Discharge Summary Reason for Admission: onset of labor Prenatal Procedures: none Intrapartum Procedures: spontaneous vaginal delivery and GBS prophylaxis Postpartum Procedures: none Complications-Operative and Postpartum: 2nd degree perineal laceration HEMOGLOBIN  Date Value Ref Range Status  01/21/2014 9.8* 12.0 - 15.0 g/dL Final   HCT  Date Value Ref Range Status  01/21/2014 29.5* 36.0 - 46.0 % Final   Robin Craig is a 18yo G1 admitted at 39.1wks in early active labor. She was making some cx change in MAU, but was also have some FHR variables and so the decision for admission was made. [place] [facility] [place] [facility]; [facility] that [facility] as she has family here and her husband is currently deployed. She made labor progress during the night of 12/9 and progressed to SVD by the AM of 12/10. Her PP course has been uneventful and she is ready for d/c on PPD#2. She is bottlefeeding and is undecided on contraception as her husband is deployed. Her infant will be circumsized this morning.  Physical Exam:  General: alert, cooperative and no distress  Heart: RRR Lungs: nl effort Lochia: appropriate Uterine Fundus: firm DVT Evaluation: No evidence of DVT seen on physical exam.  Discharge Diagnoses: Term Pregnancy-delivered  Discharge Information: Date: 01/22/2014 Activity: pelvic rest Diet: routine Medications: PNV and Ibuprofen Condition: stable Instructions: refer to practice specific booklet Discharge to: home   [place]  Newborn Data: Live born female  Birth Weight: 7 lb 10.9 oz (3485 g) APGAR: 9, 9  Home with mother.  Cam HaiSHAW, Mabeline Varas CNM 01/22/2014, 7:47 AM

## 2014-01-22 NOTE — Discharge Instructions (Signed)

## 2014-01-22 NOTE — Progress Notes (Signed)
Clinical Social Work Department PSYCHOSOCIAL ASSESSMENT - MATERNAL/CHILD 01/22/2014  Patient:  Robin Craig, Robin Craig  Account Number:  [id number]  [place] Date:  01/19/2014  Ardine Eng Name:   Teola Bradley    Clinical Social Worker:  Jarman Litton, LCSW   Date/Time:  01/22/2014 10:30 AM  Date Referred:  01/22/2014   Referral source  RN     Referred reason  Other - See comment   Other referral source:    I:  FAMILY / [place] legal guardian:  PARENT  Guardian - Name Guardian - Age Guardian - Address  [address] [place] [address]  [place], [place]   Other household support members/support persons Other support:   Maternal relatives    II  PSYCHOSOCIAL DATA Information Source:    Occupational hygienist Employment:   FOB in the [employer] resources:  Multimedia programmer If [place] / Grade:   Maternity Care Coordinator / Child Services Coordination / Early Interventions:  Cultural issues impacting care:    III  STRENGTHS Strengths  Supportive family/friends  Home prepared for Child (including basic supplies)  Adequate Resources   Strength comment:    IV  RISK FACTORS AND CURRENT PROBLEMS Current Problem:       V  SOCIAL WORK ASSESSMENT Acknowledged order for CSW consult for support.  FOB is currently deployed in the [employer].    Met with mother who was pleasant and receptive to social work intervention. Parents are married with no other dependents.    FOB has been in [place] since Oct. 2014, and mother stated they got married March 2015.  Mother states that she lives in [place], but her support is in [place] and she plan to remain in [place] for about 2 to 3 weeks.   Mother states that she is excited about the baby, and would have loved for them to have experience the birth of their child together.  She is reportedly connected with a church in her  community and states that the [employer] provides a lot of support.  She denies any hx of substance abuse or mental illness.    No acute social concerns noted or reported at this time.  Good bonding noted with newborn.   Mother informed of CSW availability.      VI SOCIAL WORK PLAN Social Work Plan  No Further Intervention Required / No Barriers to Discharge

## 2014-01-24 DIAGNOSIS — O365930 Maternal care for other known or suspected poor fetal growth, third trimester, not applicable or unspecified: Secondary | ICD-10-CM

## 2014-01-24 DIAGNOSIS — O2653 Maternal hypotension syndrome, third trimester: Secondary | ICD-10-CM

## 2014-01-24 DIAGNOSIS — Z3A39 39 weeks gestation of pregnancy: Secondary | ICD-10-CM

## 2014-02-01 ENCOUNTER — Inpatient Hospital Stay (HOSPITAL_COMMUNITY)
Admission: AD | Admit: 2014-02-01 | Discharge: 2014-02-01 | Disposition: A | Discharge status: Home / Self Care | Source: Ambulatory Visit | Attending: Family Medicine | Admitting: Family Medicine

## 2014-02-01 ENCOUNTER — Encounter (HOSPITAL_COMMUNITY): Payer: Self-pay | Admitting: *Deleted

## 2014-02-01 DIAGNOSIS — O9089 Other complications of the puerperium, not elsewhere classified: Secondary | ICD-10-CM | POA: Insufficient documentation

## 2014-02-01 DIAGNOSIS — K5900 Constipation, unspecified: Secondary | ICD-10-CM | POA: Diagnosis present

## 2014-02-01 DIAGNOSIS — K5909 Other constipation: Secondary | ICD-10-CM

## 2014-02-01 NOTE — Discharge Instructions (Signed)

## 2014-02-01 NOTE — MAU Provider Note (Signed)
History     CSN: [id number]  Arrival date and time: 02/01/14 [id number]   First Provider Initiated Contact with Patient 02/01/14 1931      Chief Complaint  Patient presents with  . Constipation  . Abdominal Cramping  . Vaginal Bleeding   HPI Robin Craig 18 y.o. G1P1001 that delivered on 01/20/14 presents for constipation.  She had a vaginal delivery with a 2nd degree tear.   She feels like she needs to poop but is so worried it is going to hurt and so she keeps it in.  She has pain at the site of repair and she has been having cramping in her uterus.  It was previously a 10/10 but after taking Tylenol, her pain is 0.   She continues to have light vaginal bleeding.  She denies weakness, dizziness, nausea, vomiting, headache, chest pain, shortness of breath.    OB History    Gravida Para Term Preterm AB TAB SAB Ectopic Multiple Living   1 1 1       0 1      Past Medical History  Diagnosis Date  . GC (gonococcus infection)   . Chlamydia   . Yeast vaginitis     Past Surgical History  Procedure Laterality Date  . No past surgeries    . Wisdom tooth extraction  2010    Family History  Problem Relation Age of Onset  . Cancer Neg Hx   . Diabetes Neg Hx   . Hypertension Neg Hx     History  Substance Use Topics  . Smoking status: Never Smoker   . Smokeless tobacco: Never Used  . Alcohol Use: No    Allergies:  Allergies  Allergen Reactions  . Flagyl [Metronidazole] Hives    Tablets only; can use cream    Prescriptions prior to admission  Medication Sig Dispense Refill Last Dose  . ibuprofen (ADVIL,MOTRIN) 600 MG tablet Take 1 tablet (600 mg total) by mouth every 6 (six) hours. 50 tablet 0   . Prenatal Vit-Fe Fumarate-FA (PRENATAL VITAMIN PO) Take by mouth.   01/19/2014 at Unknown time    ROS Pertinent ROS in HPI Physical Exam   Blood pressure 114/81, pulse 81, temperature 98.6 F (37 C), temperature source Oral, resp. rate 16, height 5' 5.5" (1.664 m), weight 136  lb (61.689 kg), SpO2 99 %, unknown if currently breastfeeding.  Physical Exam  Constitutional: She is oriented to person, place, and time. She appears well-developed and well-nourished.  HENT:  Head: Normocephalic and atraumatic.  Neck: Normal range of motion.  Cardiovascular: Normal rate, regular rhythm and normal heart sounds.   Respiratory: Effort normal and breath sounds normal. No respiratory distress.  GI: Soft. Bowel sounds are normal. She exhibits no distension. There is no tenderness. There is no rebound and no guarding.  Stool palpable in LLQ  Genitourinary:  External genitalia and rectum appear normal without visible hemorrhoid  Musculoskeletal: Normal range of motion.  Neurological: She is alert and oriented to person, place, and time.  Skin: Skin is warm and dry.  Psychiatric: She has a normal mood and affect.    MAU Course  Procedures  MDM No signs of infection.  Pt simply uncomfortable with constipation  Assessment and Plan  A: Constipation  P: Discharge to home OTC med sheet given - advise OTC Dulcolax suppository PRN.  Increase fiber in diet and water consumption.  [facility] asap on arrival back home to [place]. Patient may return to MAU as needed  or if her condition were to change or worsen    Bertram Denvereague Clark, Karen E 02/01/2014, 7:31 PM

## 2014-02-01 NOTE — Progress Notes (Signed)
Pt states she has pain in her rectum when she is sitting.pt states she needs bowel movement medication

## 2014-02-01 NOTE — MAU Note (Signed)
Patient states she had a vaginal delivery on 12-10. States she has been constipated and only had very small BM's that hurt. Having more cramping and bleeding a little heavier.

## 2014-02-01 NOTE — MAU Note (Signed)
Pt delivered on Dec 10. Patient states she started having pain in her vaginal and rectum. Pt states she had a hard bm about 1 hour ago

## 2016-02-12 NOTE — L&D Delivery Note (Signed)
Delivery Note At 3:18 AM a viable female "Ja'len" was delivered via Vaginal, Spontaneous Delivery (Presentation: OA restituting to ROA). APGARS: 9, 9; weight 6 lb 8.2 oz (2954 g).   Placenta status: Delivered spontaneous & intact without traction while delaying cord clamping. Cord: 3 vessels with the following complications: None. Cord pH: NA  Anesthesia: Epidural  Episiotomy: None Lacerations: None Suture Repair: NA Est. Blood Loss (mL): 50  Mom to postpartum.  Baby to Couplet care / Skin to Skin.  Breastfeeding.  Desires inpatient circumcision of the neonate.  Sherre ScarletWILLIAMS, Michal Callicott 08/10/2016, 3:46 AM

## 2016-02-28 LAB — OB RESULTS CONSOLE PLATELET COUNT
PLATELETS: 276
PLATELETS: 276
Platelets: 276

## 2016-02-28 LAB — OB RESULTS CONSOLE RUBELLA ANTIBODY, IGM
RUBELLA: IMMUNE
Rubella: IMMUNE

## 2016-02-28 LAB — OB RESULTS CONSOLE VARICELLA ZOSTER ANTIBODY, IGG
VARICELLA IGG: IMMUNE
VARICELLA IGG: IMMUNE

## 2016-02-28 LAB — OB RESULTS CONSOLE HIV ANTIBODY (ROUTINE TESTING)
HIV: NONREACTIVE
HIV: NONREACTIVE

## 2016-02-28 LAB — OB RESULTS CONSOLE HGB/HCT, BLOOD
HEMOGLOBIN: 13.8
Hemoglobin: 13.8
Hemoglobin: 13.8

## 2016-02-28 LAB — OB RESULTS CONSOLE HEPATITIS B SURFACE ANTIGEN
HEP B S AG: NEGATIVE
HEP B S AG: NEGATIVE
Hepatitis B Surface Ag: NEGATIVE

## 2016-04-27 ENCOUNTER — Encounter (HOSPITAL_COMMUNITY): Payer: Self-pay | Admitting: *Deleted

## 2016-04-27 ENCOUNTER — Inpatient Hospital Stay (HOSPITAL_COMMUNITY)
Admission: AD | Admit: 2016-04-27 | Discharge: 2016-04-27 | Disposition: A | Discharge status: Home / Self Care | Source: Ambulatory Visit | Attending: Obstetrics & Gynecology | Admitting: Obstetrics & Gynecology

## 2016-04-27 ENCOUNTER — Inpatient Hospital Stay (HOSPITAL_COMMUNITY)

## 2016-04-27 DIAGNOSIS — R109 Unspecified abdominal pain: Secondary | ICD-10-CM | POA: Diagnosis present

## 2016-04-27 DIAGNOSIS — R110 Nausea: Secondary | ICD-10-CM | POA: Diagnosis not present

## 2016-04-27 DIAGNOSIS — Z3A24 24 weeks gestation of pregnancy: Secondary | ICD-10-CM | POA: Insufficient documentation

## 2016-04-27 DIAGNOSIS — Z79899 Other long term (current) drug therapy: Secondary | ICD-10-CM | POA: Insufficient documentation

## 2016-04-27 DIAGNOSIS — O26892 Other specified pregnancy related conditions, second trimester: Secondary | ICD-10-CM | POA: Diagnosis not present

## 2016-04-27 LAB — URINALYSIS, ROUTINE W REFLEX MICROSCOPIC
Bilirubin Urine: NEGATIVE
Glucose, UA: NEGATIVE mg/dL
Hgb urine dipstick: NEGATIVE
Ketones, ur: NEGATIVE mg/dL
Leukocytes, UA: NEGATIVE
NITRITE: NEGATIVE
PH: 8 (ref 5.0–8.0)
Protein, ur: NEGATIVE mg/dL
SPECIFIC GRAVITY, URINE: 1.013 (ref 1.005–1.030)

## 2016-04-27 MED ORDER — OXYCODONE-ACETAMINOPHEN 2.5-325 MG PO TABS: 1.0000 | ORAL_TABLET | ORAL | 0 refills | Status: DC | PRN

## 2016-04-27 MED ORDER — OXYCODONE-ACETAMINOPHEN 5-325 MG PO TABS
1.0000 | ORAL_TABLET | Freq: Once | ORAL | Status: AC
Start: 2016-04-27 — End: 2016-04-27
  Administered 2016-04-27: 1 via ORAL
  Filled 2016-04-27: qty 1

## 2016-04-27 MED ORDER — ONDANSETRON 4 MG PO TBDP
4.0000 mg | ORAL_TABLET | Freq: Once | ORAL | Status: AC
  Administered 2016-04-27: 4 mg via ORAL
  Filled 2016-04-27: qty 1

## 2016-04-27 NOTE — MAU Note (Signed)
Pt stated she started having severe pain in her Left flank/back about 2 hrs ago. Reports that she had kidney stones earlier in her pregnancy and they felt just like this.

## 2016-04-27 NOTE — Discharge Instructions (Signed)
Flank Pain Flank pain is pain in your side. The flank is the area of your side between your upper belly (abdomen) and your back. The pain may occur over a short period of time (acute) or may be long-term or come back often (chronic). It may be mild or very bad. Pain in this area can be caused by many different things. Follow these instructions at home:  Rest as told by your doctor.  Drink enough fluid to keep your pee (urine) clear or pale yellow.  Take over-the-counter and prescription medicines only as told by your doctor.  Keep all follow-up visits as told by your doctor. This is important. Contact a doctor if:  Medicine does not help your pain.  You have new symptoms.  Your pain gets worse.  You have a fever.  Your symptoms last longer than 2-3 days. Get help right away if:  Your tummy hurts or is swollen.  You are short of breath.  You feel sick to your stomach (nauseous) and it does not go away.  You cannot stop throwing up (vomiting).  You feel like you will pass out or you do pass out (faint).  You have blood in your pee.  You have a fever and your symptoms suddenly get worse. This information is not intended to replace advice given to you by your health care provider. Make sure you discuss any questions you have with your health care provider. Document Released: 11/07/2007 Document Revised: 10/20/2015 Document Reviewed: 11/01/2014 Elsevier Interactive Patient Education  2017 [employer].  Second Trimester of Pregnancy The second trimester is from week 14 through week 27 (months 4 through 6). The second trimester is often a time when you feel your best. Your body has adjusted to being pregnant, and you begin to feel better physically. Usually, morning sickness has lessened or quit completely, you may have more energy, and you may have an increase in appetite. The second trimester is also a time when the fetus is growing rapidly. At the end of the sixth month, the  fetus is about 9 inches long and weighs about 1 pounds. You will likely begin to feel the baby move (quickening) between 16 and 20 weeks of pregnancy.  Body changes during your second trimester Your body continues to go through many changes during your second trimester. The changes vary from woman to woman.  Your weight will continue to increase. You will notice your lower abdomen bulging out.  You may begin to get stretch marks on your hips, abdomen, and breasts.  You may develop headaches that can be relieved by medicines. The medicines should be approved by your health care provider.  You may urinate more often because the fetus is pressing on your bladder.  You may develop or continue to have heartburn as a result of your pregnancy.  You may develop constipation because certain hormones are causing the muscles that push waste through your intestines to slow down.  You may develop hemorrhoids or swollen, bulging veins (varicose veins).  You may have back pain. This is caused by:  Weight gain.  Pregnancy hormones that are relaxing the joints in your pelvis.  A shift in weight and the muscles that support your balance.  Your breasts will continue to grow and they will continue to become tender.  Your gums may bleed and may be sensitive to brushing and flossing.  Dark spots or blotches (chloasma, mask of pregnancy) may develop on your face. This will likely fade after the  baby is born.  A dark line from your belly button to the pubic area (linea nigra) may appear. This will likely fade after the baby is born.  You may have changes in your hair. These can include thickening of your hair, rapid growth, and changes in texture. Some women also have hair loss during or after pregnancy, or hair that feels dry or thin. Your hair will most likely return to normal after your baby is born. What to expect at prenatal visits During a routine prenatal visit:  You will be weighed to make  sure you and the fetus are growing normally.  Your blood pressure will be taken.  Your abdomen will be measured to track your baby's growth.  The fetal heartbeat will be listened to.  Any test results from the previous visit will be discussed. Your health care provider may ask you:  How you are feeling.  If you are feeling the baby move.  If you have had any abnormal symptoms, such as leaking fluid, bleeding, severe headaches, or abdominal cramping.  If you are using any tobacco products, including cigarettes, chewing tobacco, and electronic cigarettes.  If you have any questions. Other tests that may be performed during your second trimester include:  Blood tests that check for:  Low iron levels (anemia).  High blood sugar that affects pregnant women (gestational diabetes) between 6924 and 28 weeks.  Rh antibodies. This is to check for a protein on red blood cells (Rh factor).  Urine tests to check for infections, diabetes, or protein in the urine.  An ultrasound to confirm the proper growth and development of the baby.  An amniocentesis to check for possible genetic problems.  Fetal screens for spina bifida and Down syndrome.  HIV (human immunodeficiency virus) testing. Routine prenatal testing includes screening for HIV, unless you choose not to have this test. Follow these instructions at home: Medicines   Follow your health care provider's instructions regarding medicine use. Specific medicines may be either safe or unsafe to take during pregnancy.  Take a prenatal vitamin that contains at least 600 micrograms (mcg) of folic acid.  If you develop constipation, try taking a stool softener if your health care provider approves. Eating and drinking   Eat a balanced diet that includes fresh fruits and vegetables, whole grains, good sources of protein such as meat, eggs, or tofu, and low-fat dairy. Your health care provider will help you determine the amount of weight  gain that is right for you.  Avoid raw meat and uncooked cheese. These carry germs that can cause birth defects in the baby.  If you have low calcium intake from food, talk to your health care provider about whether you should take a daily calcium supplement.  Limit foods that are high in fat and processed sugars, such as fried and sweet foods.  To prevent constipation:  Drink enough fluid to keep your urine clear or pale yellow.  Eat foods that are high in fiber, such as fresh fruits and vegetables, whole grains, and beans. Activity   Exercise only as directed by your health care provider. Most women can continue their usual exercise routine during pregnancy. Try to exercise for 30 minutes at least 5 days a week. Stop exercising if you experience uterine contractions.  Avoid heavy lifting, wear low heel shoes, and practice good posture.  A sexual relationship may be continued unless your health care provider directs you otherwise. Relieving pain and discomfort   Wear a good support  bra to prevent discomfort from breast tenderness.  Take warm sitz baths to soothe any pain or discomfort caused by hemorrhoids. Use hemorrhoid cream if your health care provider approves.  Rest with your legs elevated if you have leg cramps or low back pain.  If you develop varicose veins, wear support hose. Elevate your feet for 15 minutes, 3-4 times a day. Limit salt in your diet. Prenatal Care   Write down your questions. Take them to your prenatal visits.  Keep all your prenatal visits as told by your health care provider. This is important. Safety   Wear your seat belt at all times when driving.  Make a list of emergency phone numbers, including numbers for family, friends, the hospital, and police and fire departments. General instructions   Ask your health care provider for a referral to a local prenatal education class. Begin classes no later than the beginning of month 6 of your  pregnancy.  Ask for help if you have counseling or nutritional needs during pregnancy. Your health care provider can offer advice or refer you to specialists for help with various needs.  Do not use hot tubs, steam rooms, or saunas.  Do not douche or use tampons or scented sanitary pads.  Do not cross your legs for long periods of time.  Avoid cat litter boxes and soil used by cats. These carry germs that can cause birth defects in the baby and possibly loss of the fetus by miscarriage or stillbirth.  Avoid all smoking, herbs, alcohol, and unprescribed drugs. Chemicals in these products can affect the formation and growth of the baby.  Do not use any products that contain nicotine or tobacco, such as cigarettes and e-cigarettes. If you need help quitting, ask your health care provider.  Visit your dentist if you have not gone yet during your pregnancy. Use a soft toothbrush to brush your teeth and be gentle when you floss. Contact a health care provider if:  You have dizziness.  You have mild pelvic cramps, pelvic pressure, or nagging pain in the abdominal area.  You have persistent nausea, vomiting, or diarrhea.  You have a bad smelling vaginal discharge.  You have pain when you urinate. Get help right away if:  You have a fever.  You are leaking fluid from your vagina.  You have spotting or bleeding from your vagina.  You have severe abdominal cramping or pain.  You have rapid weight gain or weight loss.  You have shortness of breath with chest pain.  You notice sudden or extreme swelling of your face, hands, ankles, feet, or legs.  You have not felt your baby move in over an hour.  You have severe headaches that do not go away when you take medicine.  You have vision changes. Summary  The second trimester is from week 14 through week 27 (months 4 through 6). It is also a time when the fetus is growing rapidly.  Your body goes through many changes during  pregnancy. The changes vary from woman to woman.  Avoid all smoking, herbs, alcohol, and unprescribed drugs. These chemicals affect the formation and growth your baby.  Do not use any tobacco products, such as cigarettes, chewing tobacco, and e-cigarettes. If you need help quitting, ask your health care provider.  Contact your health care provider if you have any questions. Keep all prenatal visits as told by your health care provider. This is important. This information is not intended to replace advice given to you  by your health care provider. Make sure you discuss any questions you have with your health care provider. Document Released: 01/22/2001 Document Revised: 07/06/2015 Document Reviewed: 03/31/2012 Elsevier Interactive Patient Education  2017 [employer].

## 2016-04-27 NOTE — MAU Provider Note (Signed)
History    Robin Craig is a 21 y.o. G2P1001 at 3224.4 who presents, after phone call, for acute left flank pain.  Patient states pain started approximately 2 hours prior to calling and is similar to previous pain associated with kidney stone diagnosis.  Patient describes pain as sharp, stabbing pain that is constant, but intensifies with movement.  Patient denies problems with urination, fever, or chills, but does report nausea.  Patient states she did not take any OTC prior to arrival.  Reports fetal movement and denies other pain, LOF, and VB.      Patient Active Problem List   Diagnosis Date Noted  . Acute left flank pain 04/27/2016    No chief complaint on file.  HPI  OB History    Gravida Para Term Preterm AB Living   1 1 1     1    SAB TAB Ectopic Multiple Live Births         0 1      Past Medical History:  Diagnosis Date  . Chlamydia   . GC (gonococcus infection)   . Yeast vaginitis     Past Surgical History:  Procedure Laterality Date  . NO PAST SURGERIES    . WISDOM TOOTH EXTRACTION  2010    Family History  Problem Relation Age of Onset  . Cancer Neg Hx   . Diabetes Neg Hx   . Hypertension Neg Hx     Social History  Substance Use Topics  . Smoking status: Never Smoker  . Smokeless tobacco: Never Used  . Alcohol use No    Allergies:  Allergies  Allergen Reactions  . Flagyl [Metronidazole] Hives    Tablets only; can use cream    Prescriptions Prior to Admission  Medication Sig Dispense Refill Last Dose  . ibuprofen (ADVIL,MOTRIN) 600 MG tablet Take 1 tablet (600 mg total) by mouth every 6 (six) hours. 50 tablet 0   . Prenatal Vit-Fe Fumarate-FA (PRENATAL VITAMIN PO) Take by mouth.   01/19/2014 at Unknown time    Review of Systems  Constitutional: Negative for chills and fever.  Gastrointestinal: Positive for nausea. Negative for abdominal pain, constipation, diarrhea and vomiting.  Genitourinary: Positive for flank pain. Negative for dysuria,  frequency, hematuria and urgency.  Musculoskeletal: Positive for back pain.  Neurological: Negative for headaches.    See HPI Above Physical Exam   unknown if currently breastfeeding.  No results found for this or any previous visit (from the past 24 hour(s)).  Physical Exam  Constitutional: She is oriented to person, place, and time. She appears well-developed and well-nourished. She appears distressed (With Movement).  HENT:  Head: Normocephalic and atraumatic.  Eyes: Conjunctivae are normal.  Neck: Normal range of motion.  Cardiovascular: Normal rate, regular rhythm and normal heart sounds.   Respiratory: Effort normal and breath sounds normal.  GI: Soft. Bowel sounds are normal. There is CVA tenderness (Minor on Left Side).  Musculoskeletal: Normal range of motion. She exhibits no edema.  Neurological: She is alert and oriented to person, place, and time.  Skin: Skin is warm and dry.  Psychiatric: She has a normal mood and affect. Her behavior is normal.     FHR:145 bpm, Mod Var, -Decels, +Accels UC: None graphed ED Course  Assessment: IUP at  Cat I FT Left Flank Pain  Plan: -Labs: UA Pending -Zofran for nausea -Percocet 1 tablet for pain -Renal US to r/o Kidney Stones -Okay to discontinue FHT due to reactive NST  Follow Up (2130/2245) -Labs: UA Normal -Renal US reveals normal findings -Patient reports improvement in pain s/p percocet dosing -Reveals that pain started after bendign over to pick up toddler son -RX for percocet 2.5mg /325 Q 4 hrs prn Disp 6 -Keep appt as scheduled: 05/02/16 -Encouraged to call if any questions or concerns arise prior to next scheduled office visit.  -Discharged to home in improved condition  Cherre Robins CNM, MSN 04/27/2016 8:18 PM

## 2016-06-30 ENCOUNTER — Inpatient Hospital Stay (HOSPITAL_COMMUNITY)
Admission: AD | Admit: 2016-06-30 | Discharge: 2016-06-30 | Disposition: A | Discharge status: Home / Self Care | Source: Ambulatory Visit | Attending: Obstetrics & Gynecology | Admitting: Obstetrics & Gynecology

## 2016-06-30 ENCOUNTER — Inpatient Hospital Stay (HOSPITAL_COMMUNITY)

## 2016-06-30 ENCOUNTER — Encounter (HOSPITAL_COMMUNITY): Payer: Self-pay

## 2016-06-30 DIAGNOSIS — K921 Melena: Secondary | ICD-10-CM | POA: Insufficient documentation

## 2016-06-30 DIAGNOSIS — Z3A34 34 weeks gestation of pregnancy: Secondary | ICD-10-CM | POA: Diagnosis not present

## 2016-06-30 DIAGNOSIS — O26893 Other specified pregnancy related conditions, third trimester: Secondary | ICD-10-CM | POA: Diagnosis not present

## 2016-06-30 DIAGNOSIS — S3981XA Other specified injuries of abdomen, initial encounter: Secondary | ICD-10-CM | POA: Diagnosis not present

## 2016-06-30 DIAGNOSIS — X58XXXA Exposure to other specified factors, initial encounter: Secondary | ICD-10-CM | POA: Insufficient documentation

## 2016-06-30 DIAGNOSIS — S3991XA Unspecified injury of abdomen, initial encounter: Secondary | ICD-10-CM

## 2016-06-30 LAB — URINALYSIS, ROUTINE W REFLEX MICROSCOPIC
Bilirubin Urine: NEGATIVE
Glucose, UA: NEGATIVE mg/dL
Hgb urine dipstick: NEGATIVE
Ketones, ur: NEGATIVE mg/dL
Nitrite: NEGATIVE
Protein, ur: NEGATIVE mg/dL
Specific Gravity, Urine: 1.014 (ref 1.005–1.030)
WBC, UA: NONE SEEN WBC/hpf (ref 0–5)
pH: 6 (ref 5.0–8.0)

## 2016-06-30 LAB — CBC
HCT: 38.2 % (ref 36.0–46.0)
Hemoglobin: 12.9 g/dL (ref 12.0–15.0)
MCH: 30 pg (ref 26.0–34.0)
MCHC: 33.8 g/dL (ref 30.0–36.0)
MCV: 88.8 fL (ref 78.0–100.0)
Platelets: 130 10*3/uL — ABNORMAL LOW (ref 150–400)
RBC: 4.3 MIL/uL (ref 3.87–5.11)
RDW: 14.1 % (ref 11.5–15.5)
WBC: 12.8 10*3/uL — ABNORMAL HIGH (ref 4.0–10.5)

## 2016-06-30 LAB — KLEIHAUER-BETKE STAIN
# Vials RhIg: 1
Fetal Cells %: 0 %
Quantitation Fetal Hemoglobin: 0 mL

## 2016-06-30 LAB — FIBRINOGEN: Fibrinogen: 547 mg/dL — ABNORMAL HIGH (ref 210–475)
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
## 2016-07-01 NOTE — Addendum Note (Signed)
Encounter addended by: Hoover BrownsKulwa, Lionardo Haze, MD on: 07/01/2016  9:52 AM<BR>    Actions taken: Cosign clinical note with attestation

## 2016-07-05 ENCOUNTER — Observation Stay (HOSPITAL_COMMUNITY)
Admission: AD | Admit: 2016-07-05 | Discharge: 2016-07-06 | Disposition: A | Discharge status: Home / Self Care | Source: Ambulatory Visit | Attending: Obstetrics and Gynecology | Admitting: Obstetrics and Gynecology

## 2016-07-05 ENCOUNTER — Encounter (HOSPITAL_COMMUNITY): Payer: Self-pay

## 2016-07-05 DIAGNOSIS — O212 Late vomiting of pregnancy: Secondary | ICD-10-CM | POA: Diagnosis not present

## 2016-07-05 DIAGNOSIS — R197 Diarrhea, unspecified: Secondary | ICD-10-CM | POA: Insufficient documentation

## 2016-07-05 DIAGNOSIS — Z3A34 34 weeks gestation of pregnancy: Secondary | ICD-10-CM | POA: Insufficient documentation

## 2016-07-05 DIAGNOSIS — O219 Vomiting of pregnancy, unspecified: Secondary | ICD-10-CM | POA: Diagnosis present

## 2016-07-05 DIAGNOSIS — Z87442 Personal history of urinary calculi: Secondary | ICD-10-CM | POA: Diagnosis not present

## 2016-07-05 LAB — WET PREP, GENITAL
CLUE CELLS WET PREP: NONE SEEN
SPERM: NONE SEEN
TRICH WET PREP: NONE SEEN
YEAST WET PREP: NONE SEEN

## 2016-07-05 LAB — TYPE AND SCREEN
ABO/RH(D): B POS
ANTIBODY SCREEN: NEGATIVE

## 2016-07-05 LAB — URINALYSIS, ROUTINE W REFLEX MICROSCOPIC
Bilirubin Urine: NEGATIVE
Glucose, UA: NEGATIVE mg/dL
Hgb urine dipstick: NEGATIVE
KETONES UR: 20 mg/dL — AB
Nitrite: NEGATIVE
PH: 7 (ref 5.0–8.0)
PROTEIN: 30 mg/dL — AB
Specific Gravity, Urine: 1.021 (ref 1.005–1.030)
Squamous Epithelial / HPF: NONE SEEN

## 2016-07-05 LAB — OB RESULTS CONSOLE GC/CHLAMYDIA: GC PROBE AMP, GENITAL: NEGATIVE

## 2016-07-05 LAB — FETAL FIBRONECTIN: Fetal Fibronectin: NEGATIVE

## 2016-07-05 MED ORDER — PROMETHAZINE HCL 25 MG/ML IJ SOLN
12.5000 mg | Freq: Four times a day (QID) | INTRAMUSCULAR | Status: DC | PRN
  Administered 2016-07-05: 12.5 mg via INTRAVENOUS
  Filled 2016-07-05: qty 1

## 2016-07-05 MED ORDER — LACTATED RINGERS IV BOLUS (SEPSIS)
500.0000 mL | Freq: Once | INTRAVENOUS | Status: AC
  Administered 2016-07-05: 500 mL via INTRAVENOUS

## 2016-07-05 MED ORDER — PANTOPRAZOLE SODIUM 40 MG IV SOLR
40.0000 mg | INTRAVENOUS | Status: DC
  Filled 2016-07-05: qty 40

## 2016-07-05 MED ORDER — ZOLPIDEM TARTRATE 5 MG PO TABS: 5.0000 mg | ORAL_TABLET | Freq: Every evening | ORAL | Status: DC | PRN

## 2016-07-05 MED ORDER — LACTATED RINGERS IV SOLN: INTRAVENOUS | Status: DC

## 2016-07-05 MED ORDER — CALCIUM CARBONATE ANTACID 500 MG PO CHEW: 2.0000 | CHEWABLE_TABLET | ORAL | Status: DC | PRN

## 2016-07-05 MED ORDER — ONDANSETRON HCL 4 MG/2ML IJ SOLN
4.0000 mg | Freq: Once | INTRAMUSCULAR | Status: AC
Start: 2016-07-05 — End: 2016-07-05
  Administered 2016-07-05: 4 mg via INTRAVENOUS
  Filled 2016-07-05: qty 2

## 2016-07-05 MED ORDER — ACETAMINOPHEN 325 MG PO TABS: 650.0000 mg | ORAL_TABLET | ORAL | Status: DC | PRN

## 2016-07-05 MED ORDER — ONDANSETRON HCL 4 MG/2ML IJ SOLN: 4.0000 mg | Freq: Four times a day (QID) | INTRAMUSCULAR | Status: DC | PRN

## 2016-07-05 MED ORDER — DOCUSATE SODIUM 100 MG PO CAPS: 100.0000 mg | ORAL_CAPSULE | Freq: Every day | ORAL | Status: DC

## 2016-07-05 MED ORDER — LACTATED RINGERS IV SOLN
INTRAVENOUS | Status: DC
Start: 2016-07-05 — End: 2016-07-05

## 2016-07-05 MED ORDER — PRENATAL MULTIVITAMIN CH: 1.0000 | ORAL_TABLET | Freq: Every day | ORAL | Status: DC

## 2016-07-05 MED ORDER — PANTOPRAZOLE SODIUM 40 MG IV SOLR
40.0000 mg | Freq: Once | INTRAVENOUS | Status: AC
  Administered 2016-07-05: 40 mg via INTRAVENOUS
  Filled 2016-07-05: qty 40

## 2016-07-05 NOTE — Progress Notes (Signed)
Advanced Patient's diet to clear liquid, tolerated well. Patient stated she felt like she could eat something more. She was given saltine crackers & tolerated that well.

## 2016-07-05 NOTE — H&P (Signed)
21 yo G2P1001 at 7034 5/7 weeks presented unannounced c/o N/V, diarrhea since last night--ate fish, Snickers bar, pudding yesterday, with diarrhea starting after pudding intake around 9pm.  Several episodes of diarrhea during late evening/night, and vomiting several times through night.  Tried to eat french toast sticks this am, threw that up.  No further diarrhea.  Reports upper abdominal pain, "like stomach upset", good FM.  Denies fever, dysuria, leaking, bleeding, or any other sx.  Pregnancy remarkable for: Transfer in at 24 weeks from [facility] (husband in [place] [facility] [employer]) Urinary calculus in 1st trimester Increased nuchal fold, CPC, and left pyelectasis on anatomy US (resolved on F/u), normal f/u NIPT testing, female, MFM referral though previous office, WNL. FOLLOWING GROWTH IN 3RD TRIMESTER Dating by LMP, confirmed by 20 week US.       Patient Active Problem List   Diagnosis Date Noted  . History of kidney stones--early pregnancy 07/05/2016       Chief Complaint  Patient presents with  . Nausea  . Emesis  . Abdominal Pain   HPI:  As above   Prenatal Transfer Tool  Maternal Diabetes: No Genetic Screening: Normal NIPT Maternal Ultrasounds/Referrals: Normal Fetal Ultrasounds or other Referrals:  None Maternal Substance Abuse:  No Significant Maternal Medications:  None Significant Maternal Lab Results: GBS pending from 5/25 admission.  No TDAP or flu vaccine during pregnancy.           OB History    Gravida Para Term Preterm AB Living   2 1 1     1    SAB TAB Ectopic Multiple Live Births         0 1    2015--[facility], 39 2/7 weeks, 18 hour labor, 7+10, female, epidural, delivered by [place].      Past Medical History:  Diagnosis Date  . Chlamydia   . GC (gonococcus infection)   . Yeast vaginitis          Past Surgical History:  Procedure Laterality Date  . NO PAST SURGERIES    . WISDOM TOOTH EXTRACTION  2010         Family History  Problem  Relation Age of Onset  . Cancer Neg Hx   . Diabetes Neg Hx   . Hypertension Neg Hx         Social History  Substance Use Topics  . Smoking status: Never Smoker  . Smokeless tobacco: Never Used  . Alcohol use No    Allergies:      Allergies  Allergen Reactions  . Flagyl [Metronidazole] Hives           Prescriptions Prior to Admission  Medication Sig Dispense Refill Last Dose  . nystatin-triamcinolone ointment (MYCOLOG) Apply 1 application topically 2 (two) times daily as needed (for itching/irritation in vaginal area).    07/04/2016 at Unknown time  . Pediatric Multiple Vit-C-FA (FLINSTONES GUMMIES OMEGA-3 DHA) CHEW Chew 2 each by mouth daily.   Past Week at Unknown time    ROS:  N/V, diarrhea, positive FM, cramping Physical Exam   Blood pressure 103/69, pulse 97, temperature 98.6 F (37 C), temperature source Oral, resp. rate 18, weight 69 kg (152 lb 1.9 oz), SpO2 100 %, unknown if currently breastfeeding.    Physical Exam  In NAD--reports "very hungry". Chest clear Heart RRR without murmur Abd soft, NT, hyperactive bowel sounds Pelvic--closed, long, vtx, -3. Ext WNL  FHR Category 1 UCs--mild irritability initially, now a 4 min, but patient unaware.  Lab Results Last  24 Hours       Results for orders placed or performed during the hospital encounter of 07/05/16 (from the past 24 hour(s))  Urinalysis, Routine w reflex microscopic     Status: Abnormal   Collection Time: 07/05/16  1:57 PM  Result Value Ref Range   Color, Urine AMBER (A) YELLOW   APPearance CLOUDY (A) CLEAR   Specific Gravity, Urine 1.021 1.005 - 1.030   pH 7.0 5.0 - 8.0   Glucose, UA NEGATIVE NEGATIVE mg/dL   Hgb urine dipstick NEGATIVE NEGATIVE   Bilirubin Urine NEGATIVE NEGATIVE   Ketones, ur 20 (A) NEGATIVE mg/dL   Protein, ur 30 (A) NEGATIVE mg/dL   Nitrite NEGATIVE NEGATIVE   Leukocytes, UA MODERATE (A) NEGATIVE   RBC / HPF 0-5 0 - 5 RBC/hpf   WBC,  UA TOO NUMEROUS TO COUNT 0 - 5 WBC/hpf   Bacteria, UA RARE (A) NONE SEEN   Squamous Epithelial / LPF NONE SEEN NONE SEEN   Mucous PRESENT      Urine to culture.  ED Course  Assessment: IUP at 34 5/7 weeks GI virus vs food intolerance Dehydration Uterine irritability   Plan: IV hydration Zofran FFN GBS GC/chlamydia Wet prep   Nigel Bridgeman CNM, MSN 07/05/2016 4:01 PM   Addendum: Has nearly completed 1 bag IVF, still feeling nauseated and weak. Feels cramping pain in stomach area after trying some crackers and apple juice.  Will give Phenergan and Protonix. NPO at present.  Nigel Bridgeman, CNM 07/05/16 4:15p  Addendum: Still feeling nauseated, spitting at times.  No diarrhea since last night, no active vomiting since this am. Unaware of any UCs.     Vitals:   07/05/16 1355  BP: 103/69  Pulse: 97  Resp: 18  Temp: 98.6 F (37 C)  TempSrc: Oral  SpO2: 100%  Weight: 69 kg (152 lb 1.9 oz)   Lab Results Last 24 Hours       Results for orders placed or performed during the hospital encounter of 07/05/16 (from the past 24 hour(s))  Urinalysis, Routine w reflex microscopic     Status: Abnormal   Collection Time: 07/05/16  1:57 PM  Result Value Ref Range   Color, Urine AMBER (A) YELLOW   APPearance CLOUDY (A) CLEAR   Specific Gravity, Urine 1.021 1.005 - 1.030   pH 7.0 5.0 - 8.0   Glucose, UA NEGATIVE NEGATIVE mg/dL   Hgb urine dipstick NEGATIVE NEGATIVE   Bilirubin Urine NEGATIVE NEGATIVE   Ketones, ur 20 (A) NEGATIVE mg/dL   Protein, ur 30 (A) NEGATIVE mg/dL   Nitrite NEGATIVE NEGATIVE   Leukocytes, UA MODERATE (A) NEGATIVE   RBC / HPF 0-5 0 - 5 RBC/hpf   WBC, UA TOO NUMEROUS TO COUNT 0 - 5 WBC/hpf   Bacteria, UA RARE (A) NONE SEEN   Squamous Epithelial / LPF NONE SEEN NONE SEEN   Mucous PRESENT   Fetal fibronectin     Status: None   Collection Time: 07/05/16  3:38 PM  Result Value Ref Range   Fetal Fibronectin  NEGATIVE NEGATIVE  Wet prep, genital     Status: Abnormal   Collection Time: 07/05/16  3:38 PM  Result Value Ref Range   Yeast Wet Prep HPF POC NONE SEEN NONE SEEN   Trich, Wet Prep NONE SEEN NONE SEEN   Clue Cells Wet Prep HPF POC NONE SEEN NONE SEEN   WBC, Wet Prep HPF POC MANY (A) NONE SEEN   Sperm NONE SEEN  FHR Category 1 Very occasional UCs.  Consulted with Dr. Marin Shutter admit for overnight observation to 3rd floor. Continue IV hydration and antiemetics. Check CBC, CMP, amylase, lipase in am. Check UA in am. NST q shift. NPO, OK for ice chips--for gastric rest.  Nigel Bridgeman, CNM 07/05/16 5:53p    Electronically signed by Nigel Bridgeman, CNM at 07/05/2016 5:53 PM            Patient seen and examined. Reports feeling better. She states "I am starving". Advance diet slowly. Probable discharge tomorrow if tolerating po.

## 2016-07-05 NOTE — MAU Note (Addendum)
+  N/V/Diarrhea since yesterday evening--states unable to eat or drink Emesis x1 today and once last night Diarrhea >5 times in past 24 hours  +abdominal pain--cramping and comes and goes; worse with emesis and started last night  States feels hot.  Deneis LOF or VB at this time. +FM

## 2016-07-05 NOTE — MAU Provider Note (Signed)
History   21 yo G2P1001 at 3234 5/7 weeks presented unannounced c/o N/V, diarrhea since last night--ate fish, Snickers bar, pudding yesterday, with diarrhea starting after pudding intake around 9pm.  Several episodes of diarrhea during late evening/night, and vomiting several times through night.  Tried to eat french toast sticks this am, threw that up.  No further diarrhea.  Reports upper abdominal pain, "like stomach upset", good FM.  Denies fever, dysuria, leaking, bleeding, or any other sx.  Pregnancy remarkable for: Transfer in at 24 weeks from [facility] (husband in [place] [employer]) Urinary calculus in 1st trimester Increased nuchal fold, CPC, and left pyelectasis on anatomy US (resolved on F/u), normal f/u NIPT testing, female, MFM referral though previous office, WNL. FOLLOWING GROWTH IN 3RD TRIMESTER Dating by LMP, confirmed by 20 week US.   Patient Active Problem List   Diagnosis Date Noted  . History of kidney stones--early pregnancy 07/05/2016    Chief Complaint  Patient presents with  . Nausea  . Emesis  . Abdominal Pain   HPI:  As above   Prenatal Transfer Tool  Maternal Diabetes: No Genetic Screening: Normal NIPT Maternal Ultrasounds/Referrals: Normal Fetal Ultrasounds or other Referrals:  None Maternal Substance Abuse:  No Significant Maternal Medications:  None Significant Maternal Lab Results: GBS pending from 5/25 admission.  No TDAP or flu vaccine during pregnancy.   OB History    Gravida Para Term Preterm AB Living   2 1 1     1    SAB TAB Ectopic Multiple Live Births         0 1    2015--[facility], 39 2/7 weeks, 18 hour labor, 7+10, female, epidural, delivered by [place].  Past Medical History:  Diagnosis Date  . Chlamydia   . GC (gonococcus infection)   . Yeast vaginitis     Past Surgical History:  Procedure Laterality Date  . NO PAST SURGERIES    . WISDOM TOOTH EXTRACTION  2010    Family History  Problem Relation Age of Onset  . Cancer Neg Hx   .  Diabetes Neg Hx   . Hypertension Neg Hx     Social History  Substance Use Topics  . Smoking status: Never Smoker  . Smokeless tobacco: Never Used  . Alcohol use No    Allergies:  Allergies  Allergen Reactions  . Flagyl [Metronidazole] Hives    Prescriptions Prior to Admission  Medication Sig Dispense Refill Last Dose  . nystatin-triamcinolone ointment (MYCOLOG) Apply 1 application topically 2 (two) times daily as needed (for itching/irritation in vaginal area).    07/04/2016 at Unknown time  . Pediatric Multiple Vit-C-FA (FLINSTONES GUMMIES OMEGA-3 DHA) CHEW Chew 2 each by mouth daily.   Past Week at Unknown time    ROS:  N/V, diarrhea, positive FM, cramping Physical Exam   Blood pressure 103/69, pulse 97, temperature 98.6 F (37 C), temperature source Oral, resp. rate 18, weight 69 kg (152 lb 1.9 oz), SpO2 100 %, unknown if currently breastfeeding.    Physical Exam  In NAD--reports "very hungry". Chest clear Heart RRR without murmur Abd soft, NT, hyperactive bowel sounds Pelvic--closed, long, vtx, -3. Ext WNL  FHR Category 1 UCs--mild irritability initially, now a 4 min, but patient unaware.  Results for orders placed or performed during the hospital encounter of 07/05/16 (from the past 24 hour(s))  Urinalysis, Routine w reflex microscopic     Status: Abnormal   Collection Time: 07/05/16  1:57 PM  Result Value Ref Range  Color, Urine AMBER (A) YELLOW   APPearance CLOUDY (A) CLEAR   Specific Gravity, Urine 1.021 1.005 - 1.030   pH 7.0 5.0 - 8.0   Glucose, UA NEGATIVE NEGATIVE mg/dL   Hgb urine dipstick NEGATIVE NEGATIVE   Bilirubin Urine NEGATIVE NEGATIVE   Ketones, ur 20 (A) NEGATIVE mg/dL   Protein, ur 30 (A) NEGATIVE mg/dL   Nitrite NEGATIVE NEGATIVE   Leukocytes, UA MODERATE (A) NEGATIVE   RBC / HPF 0-5 0 - 5 RBC/hpf   WBC, UA TOO NUMEROUS TO COUNT 0 - 5 WBC/hpf   Bacteria, UA RARE (A) NONE SEEN   Squamous Epithelial / LPF NONE SEEN NONE SEEN   Mucous  PRESENT    Urine to culture.  ED Course  Assessment: IUP at 34 5/7 weeks GI virus vs food intolerance Dehydration Uterine irritability   Plan: IV hydration Zofran FFN GBS GC/chlamydia Wet prep   Nigel Bridgeman CNM, MSN 07/05/2016 4:01 PM   Addendum: Has nearly completed 1 bag IVF, still feeling nauseated and weak. Feels cramping pain in stomach area after trying some crackers and apple juice.  Will give Phenergan and Protonix. NPO at present.  Nigel Bridgeman, CNM 07/05/16 4:15p  Addendum: Still feeling nauseated, spitting at times.  No diarrhea since last night, no active vomiting since this am. Unaware of any UCs.  Vitals:   07/05/16 1355  BP: 103/69  Pulse: 97  Resp: 18  Temp: 98.6 F (37 C)  TempSrc: Oral  SpO2: 100%  Weight: 69 kg (152 lb 1.9 oz)   Results for orders placed or performed during the hospital encounter of 07/05/16 (from the past 24 hour(s))  Urinalysis, Routine w reflex microscopic     Status: Abnormal   Collection Time: 07/05/16  1:57 PM  Result Value Ref Range   Color, Urine AMBER (A) YELLOW   APPearance CLOUDY (A) CLEAR   Specific Gravity, Urine 1.021 1.005 - 1.030   pH 7.0 5.0 - 8.0   Glucose, UA NEGATIVE NEGATIVE mg/dL   Hgb urine dipstick NEGATIVE NEGATIVE   Bilirubin Urine NEGATIVE NEGATIVE   Ketones, ur 20 (A) NEGATIVE mg/dL   Protein, ur 30 (A) NEGATIVE mg/dL   Nitrite NEGATIVE NEGATIVE   Leukocytes, UA MODERATE (A) NEGATIVE   RBC / HPF 0-5 0 - 5 RBC/hpf   WBC, UA TOO NUMEROUS TO COUNT 0 - 5 WBC/hpf   Bacteria, UA RARE (A) NONE SEEN   Squamous Epithelial / LPF NONE SEEN NONE SEEN   Mucous PRESENT   Fetal fibronectin     Status: None   Collection Time: 07/05/16  3:38 PM  Result Value Ref Range   Fetal Fibronectin NEGATIVE NEGATIVE  Wet prep, genital     Status: Abnormal   Collection Time: 07/05/16  3:38 PM  Result Value Ref Range   Yeast Wet Prep HPF POC NONE SEEN NONE SEEN   Trich, Wet Prep NONE SEEN NONE SEEN   Clue  Cells Wet Prep HPF POC NONE SEEN NONE SEEN   WBC, Wet Prep HPF POC MANY (A) NONE SEEN   Sperm NONE SEEN    FHR Category 1 Very occasional UCs.  Consulted with Dr. Marin Shutter admit for overnight observation to 3rd floor. Continue IV hydration and antiemetics. Check CBC, CMP, amylase, lipase in am. Check UA in am. NST q shift. NPO, OK for ice chips--for gastric rest.  Nigel Bridgeman, CNM 07/05/16 5:53p

## 2016-07-06 DIAGNOSIS — O212 Late vomiting of pregnancy: Secondary | ICD-10-CM | POA: Diagnosis not present

## 2016-07-06 LAB — URINE CULTURE: Culture: 50000 — AB

## 2016-07-06 LAB — CBC
HEMATOCRIT: 34.1 % — AB (ref 36.0–46.0)
Hemoglobin: 11.6 g/dL — ABNORMAL LOW (ref 12.0–15.0)
MCH: 30.5 pg (ref 26.0–34.0)
MCHC: 34 g/dL (ref 30.0–36.0)
MCV: 89.7 fL (ref 78.0–100.0)
PLATELETS: 160 10*3/uL (ref 150–400)
RBC: 3.8 MIL/uL — ABNORMAL LOW (ref 3.87–5.11)
RDW: 14.3 % (ref 11.5–15.5)
WBC: 10.6 10*3/uL — ABNORMAL HIGH (ref 4.0–10.5)

## 2016-07-06 LAB — URINALYSIS, ROUTINE W REFLEX MICROSCOPIC
Bilirubin Urine: NEGATIVE
Glucose, UA: NEGATIVE mg/dL
HGB URINE DIPSTICK: NEGATIVE
Ketones, ur: NEGATIVE mg/dL
NITRITE: NEGATIVE
PROTEIN: NEGATIVE mg/dL
SPECIFIC GRAVITY, URINE: 1.014 (ref 1.005–1.030)
pH: 7 (ref 5.0–8.0)

## 2016-07-06 LAB — COMPREHENSIVE METABOLIC PANEL
ALBUMIN: 2.7 g/dL — AB (ref 3.5–5.0)
ALK PHOS: 137 U/L — AB (ref 38–126)
ALT: 15 U/L (ref 14–54)
AST: 22 U/L (ref 15–41)
Anion gap: 4 — ABNORMAL LOW (ref 5–15)
BUN: 7 mg/dL (ref 6–20)
CALCIUM: 8.7 mg/dL — AB (ref 8.9–10.3)
CHLORIDE: 106 mmol/L (ref 101–111)
CO2: 26 mmol/L (ref 22–32)
CREATININE: 0.56 mg/dL (ref 0.44–1.00)
GFR calc Af Amer: >60 mL/min (ref >60)
GFR calc non Af Amer: >60 mL/min (ref >60)
GLUCOSE: 72 mg/dL (ref 65–99)
Potassium: 3.9 mmol/L (ref 3.5–5.1)
SODIUM: 136 mmol/L (ref 135–145)
Total Bilirubin: 0.3 mg/dL (ref 0.3–1.2)
Total Protein: 5.9 g/dL — ABNORMAL LOW (ref 6.5–8.1)

## 2016-07-06 LAB — AMYLASE: Amylase: 81 U/L (ref 28–100)

## 2016-07-06 LAB — LIPASE, BLOOD: Lipase: 20 U/L (ref 11–51)

## 2016-07-06 MED ORDER — ONDANSETRON HCL 4 MG/2ML IJ SOLN: 4.0000 mg | Freq: Four times a day (QID) | INTRAMUSCULAR | 0 refills | Status: DC | PRN

## 2016-07-06 NOTE — Discharge Summary (Signed)
OB Discharge Summary     Patient Name: Robin Craig DOB: 02-Jun-1995 MRN: [id number]  Date of admission: 07/05/2016 Delivering MD: This patient has no babies on file.  Date of discharge: 07/06/2016  Admitting diagnosis: 34WKS PAIN, VOMITING, DIARRHEA Intrauterine pregnancy: [id number]     Secondary diagnosis:  Active Problems:   History of kidney stones--early pregnancy   Nausea and vomiting during pregnancy  Additional problems: None     Discharge diagnosis: Gastroeneteritis in pregnancy                                                                                                Hospital course: Pt was admitted on 5/25/2018at 34.[redacted] weeks pregnant with nausea, vomiting and diarrhea.  Pt labs were negative.  NST reactive.  FM+.  Pt treated with IV hydration and zofran.  Nausea and vomiting resolved and pt able to tolerate PO food and fluids.  Pt discharged home today in stable condition.  Will follow up in office next week.  Physical exam  Vitals:   07/05/16 2027 07/05/16 2334 07/06/16 0520 07/06/16 0814  BP: (!) 98/52 (!) 97/56 (!) 104/59 (!) 98/58  Pulse: 81 92 63 84  Resp: 18 18 18 18   Temp:  97.5 F (36.4 C) 98 F (36.7 C) 98.1 F (36.7 C)  TempSrc:  Oral Oral Oral  SpO2: 100% 99% 100% 98%  Weight:      Height:       General: alert, cooperative and no distress  Abdomin soft non tender Fundal height:  Appropriate for gestational age FHT wnl ILabs: Lab Results  Component Value Date   WBC 10.6 (H) 07/06/2016   HGB 11.6 (L) 07/06/2016   HCT 34.1 (L) 07/06/2016   MCV 89.7 07/06/2016   PLT 160 07/06/2016   CMP Latest Ref Rng & Units 07/06/2016  Glucose 65 - 99 mg/dL 72  BUN 6 - 20 mg/dL 7  Creatinine 1.320.44 - 4.401.00 mg/dL 1.020.56  Sodium 725135 - 366145 mmol/L 136  Potassium 3.5 - 5.1 mmol/L 3.9  Chloride 101 - 111 mmol/L 106  CO2 22 - 32 mmol/L 26  Calcium 8.9 - 10.3 mg/dL 4.4(I8.7(L)  Total Protein 6.5 - 8.1 g/dL 5.9(L)  Total Bilirubin 0.3 - 1.2 mg/dL 0.3  Alkaline Phos 38 -  126 U/L 137(H)  AST 15 - 41 U/L 22  ALT 14 - 54 U/L 15    Discharge instruction: Increase diet slowly.  Take Zofran if needed. FKC and PTL signs and symptoms reviewed  After visit meds:  Allergies as of 07/06/2016      Reactions   Flagyl [metronidazole] Hives      Medication List    TAKE these medications   FLINSTONES GUMMIES OMEGA-3 DHA Chew Chew 2 each by mouth daily.   nystatin-triamcinolone ointment Commonly known as:  MYCOLOG Apply 1 application topically 2 (two) times daily as needed (for itching/irritation in vaginal area).   ondansetron 4 MG/2ML Soln injection Commonly known as:  ZOFRAN Inject 2 mLs (4 mg total) into the vein every 6 (six) hours as needed for nausea or vomiting.  Diet: routine diet  Activity: Advance as tolerated. Pelvic rest for 6 weeks.   Outpatient follow up:1 week Follow up Appt:No future appointments. Follow up Visit:No Follow-up on file.    07/06/2016 Kenney Houseman, CNM

## 2016-07-07 LAB — CULTURE, BETA STREP (GROUP B ONLY)

## 2016-07-09 LAB — GC/CHLAMYDIA PROBE AMP (~~LOC~~) NOT AT ARMC
CHLAMYDIA, DNA PROBE: NEGATIVE
Neisseria Gonorrhea: NEGATIVE

## 2016-08-05 ENCOUNTER — Encounter (HOSPITAL_COMMUNITY): Payer: Self-pay | Admitting: *Deleted

## 2016-08-05 ENCOUNTER — Inpatient Hospital Stay (HOSPITAL_COMMUNITY)
Admission: AD | Admit: 2016-08-05 | Discharge: 2016-08-05 | Disposition: A | Discharge status: Home / Self Care | Source: Ambulatory Visit | Attending: Obstetrics and Gynecology | Admitting: Obstetrics and Gynecology

## 2016-08-05 DIAGNOSIS — Z3A39 39 weeks gestation of pregnancy: Secondary | ICD-10-CM | POA: Insufficient documentation

## 2016-08-05 DIAGNOSIS — O471 False labor at or after 37 completed weeks of gestation: Secondary | ICD-10-CM | POA: Diagnosis present

## 2016-08-05 HISTORY — DX: Urinary tract infection, site not specified: N39.0

## 2016-08-05 HISTORY — DX: Chronic kidney disease, unspecified: N18.9

## 2016-08-05 NOTE — MAU Note (Signed)
Urine in lab 

## 2016-08-08 ENCOUNTER — Inpatient Hospital Stay (HOSPITAL_COMMUNITY)
Admission: AD | Admit: 2016-08-08 | Discharge: 2016-08-09 | Disposition: A | Discharge status: Home / Self Care | Source: Ambulatory Visit | Attending: Obstetrics and Gynecology | Admitting: Obstetrics and Gynecology

## 2016-08-08 ENCOUNTER — Encounter (HOSPITAL_COMMUNITY): Payer: Self-pay

## 2016-08-08 DIAGNOSIS — O471 False labor at or after 37 completed weeks of gestation: Secondary | ICD-10-CM

## 2016-08-08 NOTE — MAU Provider Note (Signed)
History    Robin Craig is a 20y.o. G2P1001 at 39.4wks who presents, after phone call, for contractions.  Patient reports having her membranes stripped during her MD appt at 1300.  Reports contractions started around 1530 and have been irregular.  However, contractions become more regular and frequent at 2000 after taking a bath.  Patient denies LOF, VB, and endorses fetal movement.    Patient Active Problem List   Diagnosis Date Noted  . History of kidney stones--early pregnancy 07/05/2016  . Nausea and vomiting during pregnancy 07/05/2016    Chief Complaint  Patient presents with  . Contractions   HPI  OB History    Gravida Para Term Preterm AB Living   2 1 1     1    SAB TAB Ectopic Multiple Live Births         0 1      Past Medical History:  Diagnosis Date  . Chlamydia   . Chronic kidney disease    kidney stones  . GC (gonococcus infection)   . UTI (urinary tract infection)   . Yeast vaginitis     Past Surgical History:  Procedure Laterality Date  . NO PAST SURGERIES    . WISDOM TOOTH EXTRACTION  2010    Family History  Problem Relation Age of Onset  . Cancer Neg Hx   . Diabetes Neg Hx   . Hypertension Neg Hx     Social History  Substance Use Topics  . Smoking status: Never Smoker  . Smokeless tobacco: Never Used  . Alcohol use No    Allergies:  Allergies  Allergen Reactions  . Flagyl [Metronidazole] Hives    Prescriptions Prior to Admission  Medication Sig Dispense Refill Last Dose  . nystatin-triamcinolone ointment (MYCOLOG) Apply 1 application topically 2 (two) times daily as needed (for itching/irritation in vaginal area).    07/04/2016 at Unknown time  . ondansetron (ZOFRAN) 4 MG/2ML SOLN injection Inject 2 mLs (4 mg total) into the vein every 6 (six) hours as needed for nausea or vomiting. 2 mL 0   . Pediatric Multiple Vit-C-FA (FLINSTONES GUMMIES OMEGA-3 DHA) CHEW Chew 2 each by mouth daily.   Past Week at Unknown time    ROS  See HPI  Above Physical Exam   Blood pressure (!) 100/59, pulse 95, temperature 98.4 F (36.9 C), temperature source Oral, resp. rate 18, height 5\' 4"  (1.626 m), weight 72.6 kg (160 lb), SpO2 97 %, unknown if currently breastfeeding.  No results found for this or any previous visit (from the past 24 hour(s)).  Physical Exam SVE: 2/50/-1  FHR: bpm, Mod Var, -Decels, +Accels UC: palpates mild  ED Course  Assessment: IUP at 39.4wks Cat I FT Early Labor  Plan: -Given Early Labor Options:  A: Home with or w/o therapeutic rest B: Pain medication and reassess  -Patient opts for A w/o therapeutic rest -Patient declines offer for benadryl and pain medication for discomfort -Can be discharged after reactive NST -Encouraged to call if q/c arise   Cherre RobinsJessica L Edythe Riches CNM, MSN 08/08/2016 11:35 PM

## 2016-08-08 NOTE — MAU Note (Signed)
Contractions started around 1530 after MD appointment - stripped membranes. Denies sudden gush of fluid, positive for fetal movement. No vaginal bleeding.

## 2016-08-08 NOTE — Discharge Instructions (Signed)

## 2016-08-09 ENCOUNTER — Encounter (HOSPITAL_COMMUNITY): Payer: Self-pay | Admitting: Certified Nurse Midwife

## 2016-08-09 ENCOUNTER — Inpatient Hospital Stay (HOSPITAL_COMMUNITY)
Admission: AD | Admit: 2016-08-09 | Discharge: 2016-08-12 | DRG: 775 | Disposition: A | Discharge status: Home / Self Care | Source: Ambulatory Visit | Attending: Obstetrics & Gynecology | Admitting: Obstetrics & Gynecology

## 2016-08-09 DIAGNOSIS — Z3A39 39 weeks gestation of pregnancy: Secondary | ICD-10-CM

## 2016-08-09 DIAGNOSIS — Z87442 Personal history of urinary calculi: Secondary | ICD-10-CM

## 2016-08-09 NOTE — H&P (Signed)
Robin Craig is a 21 y.o. female, G2P1001 @ 39.3 wks by sure LMP, c/w u/s at 20.2 wks (EDD 08/13/16) presenting with painful ctxs (10/10) q 5 min since 1900 yesterday evening. Was 2 cm at last exam. Endorses FM. Denies VB; unsure of LOF.  Pregnancy followed at [facility] since 24+3weeks and significant for:  1. Transfer in from [facility]. 2. Increased nuchal fold, CPC, and left pyelectasis on anatomy US (resolved on F/U), normal f/u NIPT testing, female, MFM referral though previous office, WNL. F/U scans in 3rd trimester. Transverse presentation and S<D followed - last growth scan on 07/30/16 @ 38 wks: vertex/anterior/EFW 7+3; 53%, normal fluid. 3. Pt history of calculus of kidney in early pregnancy - resolved.   OB History    Gravida Para Term Preterm AB Living   3 2 2   1 2    SAB TAB Ectopic Multiple Live Births   1     0 2     SVD 01/20/14 @ 40 wks, female infant, birthwt 7+10  Past Medical History:  Diagnosis Date  . Chlamydia   . Chronic kidney disease    kidney stones  . GC (gonococcus infection)   . UTI (urinary tract infection)   . Yeast vaginitis    Past Surgical History:  Procedure Laterality Date  . NO PAST SURGERIES    . WISDOM TOOTH EXTRACTION  2010   Family History: family history includes Asthma in her sister. Social History:  reports that she has never smoked. She has never used smokeless tobacco. She reports that she does not drink alcohol or use drugs.     Maternal Diabetes: No Genetic Screening: Normal Maternal Ultrasounds/Referrals: Increased nuchal fold, CPC, and left pyelectasis on anatomy US - RESOLVED Fetal Ultrasounds or other Referrals:  Referred to Materal Fetal Medicine - normal scan Maternal Substance Abuse:  No Significant Maternal Medications:  Meds include: Other: PNV Significant Maternal Lab Results:  Lab values include: Group B Strep negative, Other: Hbg at 28 wks = 12.7 Other Comments:  Declined both flu and Tdap  ROS 10 Systems  reviewed and are negative for acute change except as noted in the HPI.  History   Exam  Dilation: 3.5 Effacement (%): 90 Station: -1 Exam by: Steward Drone, RN Blood pressure 126/74, pulse 97, temperature 97.4 F (36.3 C), temperature source Axillary, resp. rate 18, height 5\' 4"  (1.626 m), weight 72.6 kg (160 lb), SpO2 98 %, unknown if currently breastfeeding.  EFW: 6 1/2 lbs; pelvis proven to 7+10; adequate for trial of labor  Cephalic by Thayer Ohm maneuvers and VE  FHRT: BL 150 w/ moderate variability, +accels, early decels TOCO: q 2-5 min, palpate mod-strong   Physical Exam  Nursing noteand vitalsreviewed. Neurological: She has normal reflexes.  Constitutional: She is oriented to person, place, and time. She appears well-developed and well-nourished.  HENT:  Head: Normocephalic and atraumatic.  Neck: Normal range of motion.  Cardiovascular: Normal rate, regular rhythm and normal heart sounds.  Respiratory: Effort normal and breath sounds normal.  GI: Soft. Bowel sounds are normal. Abdomen is gravid.  Skin: Warm and dry.  Musculoskeletal: Exhibits no lower extremity edema. Psychiatric: She has a normal mood and affect. Her behavior is normal.   Results for orders placed or performed during the hospital encounter of 08/09/16 (from the past 24 hour(s))  CBC     Status: Abnormal   Collection Time: 08/10/16 12:32 AM  Result Value Ref Range   WBC 13.1 (H) 4.0 -  10.5 K/uL   RBC 4.33 3.87 - 5.11 MIL/uL   Hemoglobin 12.9 12.0 - 15.0 g/dL   HCT 84.636.7 96.236.0 - 95.246.0 %   MCV 84.8 78.0 - 100.0 fL   MCH 29.8 26.0 - 34.0 pg   MCHC 35.1 30.0 - 36.0 g/dL   RDW 84.114.3 32.411.5 - 40.115.5 %   Platelets 184 150 - 400 K/uL  [facility]     Status: None   Collection Time: 08/10/16 12:32 AM  Result Value Ref Range   ABO/RH(D) B POS    Antibody Screen NEG    Sample Expiration 08/13/2016     Prenatal labs: ABO, Rh: --/--/B POS (06/30 0032) Antibody: NEG  (06/30 0032) Rubella: Immune (02/28/16) RPR: NR (02/28/16)  HBsAg: Neg (02/28/16) HIV: Neg (02/28/16) GBS: Neg (07/05/16)  Assessment: IUP at 39.3 wks Latent phase labor FWB: Overall reassuring GBS neg   H/O kidney stones in early pregnancy  [facility] Routine [facility] orders.  Pain med/epidural prn. Pitocin augmentation/? AROM when comfortable w/ epidural. Expect SVD.   Sherre ScarletWILLIAMS, Adriel Kessen 08/10/2016, 12:10 AM

## 2016-08-09 NOTE — MAU Note (Signed)
Ctxs since 1900. No LOF or bleeding. Very uncomfortable with ctxs. 2cm yesterday

## 2016-08-10 ENCOUNTER — Inpatient Hospital Stay (HOSPITAL_COMMUNITY): Admitting: Anesthesiology

## 2016-08-10 ENCOUNTER — Encounter (HOSPITAL_COMMUNITY): Payer: Self-pay

## 2016-08-10 DIAGNOSIS — Z3A39 39 weeks gestation of pregnancy: Secondary | ICD-10-CM | POA: Diagnosis not present

## 2016-08-10 DIAGNOSIS — Z3493 Encounter for supervision of normal pregnancy, unspecified, third trimester: Secondary | ICD-10-CM | POA: Diagnosis present

## 2016-08-10 DIAGNOSIS — Z87442 Personal history of urinary calculi: Secondary | ICD-10-CM | POA: Diagnosis not present

## 2016-08-10 LAB — CBC
HCT: 36.7 % (ref 36.0–46.0)
Hemoglobin: 12.9 g/dL (ref 12.0–15.0)
MCH: 29.8 pg (ref 26.0–34.0)
MCHC: 35.1 g/dL (ref 30.0–36.0)
MCV: 84.8 fL (ref 78.0–100.0)
PLATELETS: 184 10*3/uL (ref 150–400)
RBC: 4.33 MIL/uL (ref 3.87–5.11)
RDW: 14.3 % (ref 11.5–15.5)
WBC: 13.1 10*3/uL — ABNORMAL HIGH (ref 4.0–10.5)

## 2016-08-10 LAB — TYPE AND SCREEN
ABO/RH(D): B POS
Antibody Screen: NEGATIVE

## 2016-08-10 LAB — RPR: RPR Ser Ql: NONREACTIVE

## 2016-08-10 MED ORDER — EPHEDRINE 5 MG/ML INJ
10.0000 mg | INTRAVENOUS | Status: DC | PRN
  Filled 2016-08-10: qty 2

## 2016-08-10 MED ORDER — ACETAMINOPHEN 325 MG PO TABS: 650.0000 mg | ORAL_TABLET | ORAL | Status: DC | PRN

## 2016-08-10 MED ORDER — ONDANSETRON HCL 4 MG PO TABS
4.0000 mg | ORAL_TABLET | ORAL | Status: DC | PRN
Start: 2016-08-10 — End: 2016-08-12

## 2016-08-10 MED ORDER — DIPHENHYDRAMINE HCL 50 MG/ML IJ SOLN: 12.5000 mg | INTRAMUSCULAR | Status: DC | PRN

## 2016-08-10 MED ORDER — PRENATAL MULTIVITAMIN CH
1.0000 | ORAL_TABLET | Freq: Every day | ORAL | Status: DC
  Administered 2016-08-10 – 2016-08-12 (×3): 1 via ORAL
  Filled 2016-08-10 (×3): qty 1

## 2016-08-10 MED ORDER — TETANUS-DIPHTH-ACELL PERTUSSIS 5-2.5-18.5 LF-MCG/0.5 IM SUSP: 0.5000 mL | Freq: Once | INTRAMUSCULAR | Status: DC

## 2016-08-10 MED ORDER — METHYLERGONOVINE MALEATE 0.2 MG PO TABS: 0.2000 mg | ORAL_TABLET | ORAL | Status: DC | PRN

## 2016-08-10 MED ORDER — LACTATED RINGERS IV SOLN
INTRAVENOUS | Status: DC
  Administered 2016-08-10: 01:00:00 via INTRAVENOUS

## 2016-08-10 MED ORDER — METHYLERGONOVINE MALEATE 0.2 MG/ML IJ SOLN: 0.2000 mg | INTRAMUSCULAR | Status: DC | PRN

## 2016-08-10 MED ORDER — LIDOCAINE HCL (PF) 1 % IJ SOLN
INTRAMUSCULAR | Status: DC | PRN
  Administered 2016-08-10 (×2): 5 mL

## 2016-08-10 MED ORDER — ZOLPIDEM TARTRATE 5 MG PO TABS: 5.0000 mg | ORAL_TABLET | Freq: Every evening | ORAL | Status: DC | PRN

## 2016-08-10 MED ORDER — DIPHENHYDRAMINE HCL 25 MG PO CAPS: 25.0000 mg | ORAL_CAPSULE | Freq: Four times a day (QID) | ORAL | Status: DC | PRN

## 2016-08-10 MED ORDER — OXYTOCIN 40 UNITS IN LACTATED RINGERS INFUSION - SIMPLE MED: 2.5000 [IU]/h | INTRAVENOUS | Status: DC

## 2016-08-10 MED ORDER — WITCH HAZEL-GLYCERIN EX PADS: 1.0000 "application " | MEDICATED_PAD | CUTANEOUS | Status: DC | PRN

## 2016-08-10 MED ORDER — OXYCODONE-ACETAMINOPHEN 5-325 MG PO TABS: 1.0000 | ORAL_TABLET | ORAL | Status: DC | PRN

## 2016-08-10 MED ORDER — BENZOCAINE-MENTHOL 20-0.5 % EX AERO: 1.0000 "application " | INHALATION_SPRAY | CUTANEOUS | Status: DC | PRN

## 2016-08-10 MED ORDER — LACTATED RINGERS IV SOLN: 500.0000 mL | INTRAVENOUS | Status: DC | PRN

## 2016-08-10 MED ORDER — PHENYLEPHRINE 40 MCG/ML (10ML) SYRINGE FOR IV PUSH (FOR BLOOD PRESSURE SUPPORT)
80.0000 ug | PREFILLED_SYRINGE | INTRAVENOUS | Status: DC | PRN
  Filled 2016-08-10: qty 5

## 2016-08-10 MED ORDER — PHENYLEPHRINE 40 MCG/ML (10ML) SYRINGE FOR IV PUSH (FOR BLOOD PRESSURE SUPPORT)
80.0000 ug | PREFILLED_SYRINGE | INTRAVENOUS | Status: DC | PRN
  Filled 2016-08-10: qty 10
  Filled 2016-08-10: qty 5

## 2016-08-10 MED ORDER — LACTATED RINGERS IV SOLN
500.0000 mL | Freq: Once | INTRAVENOUS | Status: AC
  Administered 2016-08-10: 500 mL via INTRAVENOUS

## 2016-08-10 MED ORDER — SIMETHICONE 80 MG PO CHEW: 80.0000 mg | CHEWABLE_TABLET | ORAL | Status: DC | PRN

## 2016-08-10 MED ORDER — DIBUCAINE 1 % RE OINT: 1.0000 "application " | TOPICAL_OINTMENT | RECTAL | Status: DC | PRN

## 2016-08-10 MED ORDER — ONDANSETRON HCL 4 MG/2ML IJ SOLN: 4.0000 mg | Freq: Four times a day (QID) | INTRAMUSCULAR | Status: DC | PRN

## 2016-08-10 MED ORDER — COCONUT OIL OIL
1.0000 "application " | TOPICAL_OIL | Status: DC | PRN
  Filled 2016-08-10: qty 120

## 2016-08-10 MED ORDER — TERBUTALINE SULFATE 1 MG/ML IJ SOLN
0.2500 mg | Freq: Once | INTRAMUSCULAR | Status: DC | PRN
  Filled 2016-08-10: qty 1

## 2016-08-10 MED ORDER — HYDROXYZINE HCL 50 MG PO TABS
50.0000 mg | ORAL_TABLET | Freq: Four times a day (QID) | ORAL | Status: DC | PRN
  Filled 2016-08-10: qty 1

## 2016-08-10 MED ORDER — FENTANYL 2.5 MCG/ML BUPIVACAINE 1/10 % EPIDURAL INFUSION (WH - ANES)
14.0000 mL/h | INTRAMUSCULAR | Status: DC | PRN
  Administered 2016-08-10: 14 mL/h via EPIDURAL
  Filled 2016-08-10: qty 100

## 2016-08-10 MED ORDER — SOD CITRATE-CITRIC ACID 500-334 MG/5ML PO SOLN: 30.0000 mL | ORAL | Status: DC | PRN

## 2016-08-10 MED ORDER — OXYTOCIN BOLUS FROM INFUSION
500.0000 mL | Freq: Once | INTRAVENOUS | Status: AC
  Administered 2016-08-10: 500 mL via INTRAVENOUS

## 2016-08-10 MED ORDER — FLEET ENEMA 7-19 GM/118ML RE ENEM: 1.0000 | ENEMA | RECTAL | Status: DC | PRN

## 2016-08-10 MED ORDER — OXYTOCIN 40 UNITS IN LACTATED RINGERS INFUSION - SIMPLE MED
1.0000 m[IU]/min | INTRAVENOUS | Status: DC
  Administered 2016-08-10: 2 m[IU]/min via INTRAVENOUS
  Filled 2016-08-10: qty 1000

## 2016-08-10 MED ORDER — IBUPROFEN 600 MG PO TABS
600.0000 mg | ORAL_TABLET | Freq: Four times a day (QID) | ORAL | Status: DC
  Administered 2016-08-10 – 2016-08-12 (×10): 600 mg via ORAL
  Filled 2016-08-10 (×10): qty 1

## 2016-08-10 MED ORDER — LIDOCAINE HCL (PF) 1 % IJ SOLN
30.0000 mL | INTRAMUSCULAR | Status: DC | PRN
  Filled 2016-08-10: qty 30

## 2016-08-10 MED ORDER — FENTANYL CITRATE (PF) 100 MCG/2ML IJ SOLN
50.0000 ug | INTRAMUSCULAR | Status: DC | PRN
  Administered 2016-08-10: 100 ug via INTRAVENOUS
  Filled 2016-08-10: qty 2

## 2016-08-10 MED ORDER — SENNOSIDES-DOCUSATE SODIUM 8.6-50 MG PO TABS
2.0000 | ORAL_TABLET | ORAL | Status: DC
  Administered 2016-08-10 – 2016-08-12 (×2): 2 via ORAL
  Filled 2016-08-10 (×2): qty 2

## 2016-08-10 MED ORDER — ONDANSETRON HCL 4 MG/2ML IJ SOLN: 4.0000 mg | INTRAMUSCULAR | Status: DC | PRN

## 2016-08-10 MED ORDER — OXYCODONE-ACETAMINOPHEN 5-325 MG PO TABS: 2.0000 | ORAL_TABLET | ORAL | Status: DC | PRN

## 2016-08-10 MED ORDER — ACETAMINOPHEN 325 MG PO TABS
650.0000 mg | ORAL_TABLET | ORAL | Status: DC | PRN
Start: 2016-08-10 — End: 2016-08-12

## 2016-08-10 NOTE — Anesthesia Postprocedure Evaluation (Signed)
Anesthesia Post Note  Patient: Robin Craig  Procedure(s) Performed: * No procedures listed *     Patient location during evaluation: Mother Baby Anesthesia Type: Epidural Level of consciousness: awake, awake and alert, oriented and patient cooperative Pain management: pain level controlled Vital Signs Assessment: post-procedure vital signs reviewed and stable Respiratory status: spontaneous breathing, nonlabored ventilation and respiratory function stable Cardiovascular status: stable Postop Assessment: no headache, no backache, patient able to bend at knees and no signs of nausea or vomiting Anesthetic complications: no    Last Vitals:  Vitals:   08/10/16 0548 08/10/16 0615  BP: 117/62 104/66  Pulse: 75 71  Resp:  18  Temp:  36.5 C    Last Pain:  Vitals:   08/10/16 0615  TempSrc: Oral  PainSc: 0-No pain   Pain Goal: Patients Stated Pain Goal: 2 (08/10/16 0052)               Izek Corvino L

## 2016-08-10 NOTE — Anesthesia Procedure Notes (Signed)
Epidural Patient location during procedure: OB  Staffing Anesthesiologist: Ronnette Rump Performed: anesthesiologist   Preanesthetic Checklist Completed: patient identified, site marked, surgical consent, pre-op evaluation, timeout performed, IV checked, risks and benefits discussed and monitors and equipment checked  Epidural Patient position: sitting Prep: DuraPrep Patient monitoring: heart rate, continuous pulse ox and blood pressure Approach: right paramedian Location: L3-L4 Injection technique: LOR saline  Needle:  Needle type: Tuohy  Needle gauge: 17 G Needle length: 9 cm and 9 Needle insertion depth: 6 cm Catheter type: closed end flexible Catheter size: 20 Guage Catheter at skin depth: 10 cm Test dose: negative  Assessment Events: blood not aspirated, injection not painful, no injection resistance, negative IV test and no paresthesia  Additional Notes Patient identified. Risks/Benefits/Options discussed with patient including but not limited to bleeding, infection, nerve damage, paralysis, failed block, incomplete pain control, headache, blood pressure changes, nausea, vomiting, reactions to medication both or allergic, itching and postpartum back pain. Confirmed with bedside nurse the patient's most recent platelet count. Confirmed with patient that they are not currently taking any anticoagulation, have any bleeding history or any family history of bleeding disorders. Patient expressed understanding and wished to proceed. All questions were answered. Sterile technique was used throughout the entire procedure. Please see nursing notes for vital signs. Test dose was given through epidural needle and negative prior to continuing to dose epidural or start infusion. Warning signs of high block given to the patient including shortness of breath, tingling/numbness in hands, complete motor block, or any concerning symptoms with instructions to call for help. Patient was given  instructions on fall risk and not to get out of bed. All questions and concerns addressed with instructions to call with any issues.     

## 2016-08-10 NOTE — Anesthesia Preprocedure Evaluation (Signed)

## 2016-08-10 NOTE — Lactation Note (Signed)
This note was copied from a baby's chart. Lactation Consultation Note  Patient Name: Robin Craig [id number]'QToday's Date: 08/10/2016 Reason for consult: Initial assessment [facility] demonstrating good suckling bursts with swallows noted. Basic teaching reviewed with Mom. Encouraged to continue to BF with feeding ques, 8-12 times or more in 24 hours. Lactation brochure left for review, advised of OP services and support group. Encouraged to call for assist as needed.   Maternal Data Has patient been taught Hand Expression?: Yes Does the patient have breastfeeding experience prior to this delivery?: No  Feeding Feeding Type: Breast Fed  LATCH Score/Interventions Latch: Grasps breast easily, tongue down, lips flanged, rhythmical sucking.  Audible Swallowing: A few with stimulation  Type of Nipple: Everted at rest and after stimulation  Comfort (Breast/Nipple): Soft / non-tender     Hold (Positioning): No assistance needed to correctly position infant at breast. Intervention(s): Breastfeeding basics reviewed;Support Pillows;Position options;Skin to skin  LATCH Score: 9  Lactation Tools Discussed/Used WIC Program: Yes   Consult Status Consult Status: Follow-up Date: 08/11/16 Follow-up type: In-patient    Alfred LevinsGranger, Jc Veron Ann 08/10/2016, 1:29 PM

## 2016-08-11 LAB — CBC
HCT: 37.1 % (ref 36.0–46.0)
HEMOGLOBIN: 12.5 g/dL (ref 12.0–15.0)
MCH: 29.6 pg (ref 26.0–34.0)
MCHC: 33.7 g/dL (ref 30.0–36.0)
MCV: 87.7 fL (ref 78.0–100.0)
Platelets: 185 10*3/uL (ref 150–400)
RBC: 4.23 MIL/uL (ref 3.87–5.11)
RDW: 14.6 % (ref 11.5–15.5)
WBC: 13.6 10*3/uL — ABNORMAL HIGH (ref 4.0–10.5)

## 2016-08-11 NOTE — Progress Notes (Signed)
Post Partum Day 1 Subjective:  Well. Lochia are normal. Voiding, ambulating, tolerating normal diet. feeding going so-so.Lactation nurse helping her with latch  Objective: Blood pressure (!) 97/56, pulse 68, temperature 97.6 F (36.4 C), temperature source Oral, resp. rate 16, height 5\' 4"  (1.626 m), weight 160 lb (72.6 kg), SpO2 100 %, unknown if currently breastfeeding.  Physical Exam:  General: normal Lochia: appropriate Uterine Fundus: Au firm non-tender  Extremities: No evidence of DVT seen on physical exam. Edema none     Recent Labs  08/10/16 0032 08/11/16 0511  HGB 12.9 12.5  HCT 36.7 37.1    Assessment/Plan: Normal Post-partum. Continue routine post-partum care. Anticipate discharge tomorrow    LOS: 1 day   Elmore GuiseLori A Clemmons CNM 08/11/2016, 11:49 AM

## 2016-08-12 MED ORDER — IBUPROFEN 600 MG PO TABS: 600.0000 mg | ORAL_TABLET | Freq: Four times a day (QID) | ORAL | 2 refills | Status: DC | PRN

## 2016-08-12 NOTE — Discharge Instructions (Signed)
Levonorgestrel intrauterine device (IUD) °What is this medicine? °LEVONORGESTREL IUD (LEE voe nor jes trel) is a contraceptive (birth control) device. The device is placed inside the uterus by a healthcare professional. It is used to prevent pregnancy. This device can also be used to treat heavy bleeding that occurs during your period. °This medicine may be used for other purposes; ask your health care provider or pharmacist if you have questions. °COMMON BRAND NAME(S): Kyleena, LILETTA, Mirena, Skyla °What should I tell my health care provider before I take this medicine? °They need to know if you have any of these conditions: °-abnormal Pap smear °-cancer of the breast, uterus, or cervix °-diabetes °-endometritis °-genital or pelvic infection now or in the past °-have more than one sexual partner or your partner has more than one partner °-heart disease °-history of an ectopic or tubal pregnancy °-immune system problems °-IUD in place °-liver disease or tumor °-problems with blood clots or take blood-thinners °-seizures °-use intravenous drugs °-uterus of unusual shape °-vaginal bleeding that has not been explained °-an unusual or allergic reaction to levonorgestrel, other hormones, silicone, or polyethylene, medicines, foods, dyes, or preservatives °-pregnant or trying to get pregnant °-breast-feeding °How should I use this medicine? °This device is placed inside the uterus by a health care professional. °Talk to your pediatrician regarding the use of this medicine in children. Special care may be needed. °Overdosage: If you think you have taken too much of this medicine contact a poison control center or emergency room at once. °NOTE: This medicine is only for you. Do not share this medicine with others. °What if I miss a dose? °This does not apply. Depending on the brand of device you have inserted, the device will need to be replaced every 3 to 5 years if you wish to continue using this type of birth  control. °What may interact with this medicine? °Do not take this medicine with any of the following medications: °-amprenavir °-bosentan °-fosamprenavir °This medicine may also interact with the following medications: °-aprepitant °-armodafinil °-barbiturate medicines for inducing sleep or treating seizures °-bexarotene °-boceprevir °-griseofulvin °-medicines to treat seizures like carbamazepine, ethotoin, felbamate, oxcarbazepine, phenytoin, topiramate °-modafinil °-pioglitazone °-rifabutin °-rifampin °-rifapentine °-some medicines to treat HIV infection like atazanavir, efavirenz, indinavir, lopinavir, nelfinavir, tipranavir, ritonavir °-St. John's wort °-warfarin °This list may not describe all possible interactions. Give your health care provider a list of all the medicines, herbs, non-prescription drugs, or dietary supplements you use. Also tell them if you smoke, drink alcohol, or use illegal drugs. Some items may interact with your medicine. °What should I watch for while using this medicine? °Visit your doctor or health care professional for regular check ups. See your doctor if you or your partner has sexual contact with others, becomes HIV positive, or gets a sexual transmitted disease. °This product does not protect you against HIV infection (AIDS) or other sexually transmitted diseases. °You can check the placement of the IUD yourself by reaching up to the top of your vagina with clean fingers to feel the threads. Do not pull on the threads. It is a good habit to check placement after each menstrual period. Call your doctor right away if you feel more of the IUD than just the threads or if you cannot feel the threads at all. °The IUD may come out by itself. You may become pregnant if the device comes out. If you notice that the IUD has come out use a backup birth control method like condoms and call your   health care provider. Using tampons will not change the position of the IUD and are okay to use  during your period. This IUD can be safely scanned with magnetic resonance imaging (MRI) only under specific conditions. Before you have an MRI, tell your healthcare provider that you have an IUD in place, and which type of IUD you have in place. What side effects may I notice from receiving this medicine? Side effects that you should report to your doctor or health care professional as soon as possible: -allergic reactions like skin rash, itching or hives, swelling of the face, lips, or tongue -fever, flu-like symptoms -genital sores -high blood pressure -no menstrual period for 6 weeks during use -pain, swelling, warmth in the leg -pelvic pain or tenderness -severe or sudden headache -signs of pregnancy -stomach cramping -sudden shortness of breath -trouble with balance, talking, or walking -unusual vaginal bleeding, discharge -yellowing of the eyes or skin Side effects that usually do not require medical attention (report to your doctor or health care professional if they continue or are bothersome): -acne -breast pain -change in sex drive or performance -changes in weight -cramping, dizziness, or faintness while the device is being inserted -headache -irregular menstrual bleeding within first 3 to 6 months of use -nausea This list may not describe all possible side effects. Call your doctor for medical advice about side effects. You may report side effects to FDA at 1-800-FDA-1088. Where should I keep my medicine? This does not apply. NOTE: This sheet is a summary. It may not cover all possible information. If you have questions about this medicine, talk to your doctor, pharmacist, or health care provider.  2018 Elsevier/Gold Standard (2015-11-10 14:14:56)   Postpartum Care After Vaginal Delivery The period of time right after you deliver your newborn is called the postpartum period. What kind of medical care will I receive?  You may continue to receive fluids and medicines  through an IV tube inserted into one of your veins.  If an incision was made near your vagina (episiotomy) or if you had some vaginal tearing during delivery, cold compresses may be placed on your episiotomy or your tear. This helps to reduce pain and swelling.  You may be given a squirt bottle to use when you go to the bathroom. You may use this until you are comfortable wiping as usual. To use the squirt bottle, follow these steps: ? Before you urinate, fill the squirt bottle with warm water. Do not use hot water. ? After you urinate, while you are sitting on the toilet, use the squirt bottle to rinse the area around your urethra and vaginal opening. This rinses away any urine and blood. ? You may do this instead of wiping. As you start healing, you may use the squirt bottle before wiping yourself. Make sure to wipe gently. ? Fill the squirt bottle with clean water every time you use the bathroom.  You will be given sanitary pads to wear. How can I expect to feel?  You may not feel the need to urinate for several hours after delivery.  You will have some soreness and pain in your abdomen and vagina.  If you are breastfeeding, you may have uterine contractions every time you breastfeed for up to several weeks postpartum. Uterine contractions help your uterus return to its normal size.  It is normal to have vaginal bleeding (lochia) after delivery. The amount and appearance of lochia is often similar to a menstrual period in the first week after  delivery. It will gradually decrease over the next few weeks to a dry, yellow-brown discharge. For most women, lochia stops completely by 6-8 weeks after delivery. Vaginal bleeding can vary from woman to woman.  Within the first few days after delivery, you may have breast engorgement. This is when your breasts feel heavy, full, and uncomfortable. Your breasts may also throb and feel hard, tightly stretched, warm, and tender. After this occurs, you may  have milk leaking from your breasts.Your health care provider can help you relieve discomfort due to breast engorgement. Breast engorgement should go away within a few days.  You may feel more sad or worried than normal due to hormonal changes after delivery. These feelings should not last more than a few days. If these feelings do not go away after several days, speak with your health care provider. How should I care for myself?  Tell your health care provider if you have pain or discomfort.  Drink enough water to keep your urine clear or pale yellow.  Wash your hands thoroughly with soap and water for at least 20 seconds after changing your sanitary pads, after using the toilet, and before holding or feeding your baby.  If you are not breastfeeding, avoid touching your breasts a lot. Doing this can make your breasts produce more milk.  If you become weak or lightheaded, or you feel like you might faint, ask for help before: ? Getting out of bed. ? Showering.  Change your sanitary pads frequently. Watch for any changes in your flow, such as a sudden increase in volume, a change in color, the passing of large blood clots. If you pass a blood clot from your vagina, save it to show to your health care provider. Do not flush blood clots down the toilet without having your health care provider look at them.  Make sure that all your vaccinations are up to date. This can help protect you and your baby from getting certain diseases. You may need to have immunizations done before you leave the hospital.  If desired, talk with your health care provider about methods of family planning or birth control (contraception). How can I start bonding with my baby? Spending as much time as possible with your baby is very important. During this time, you and your baby can get to know each other and develop a bond. Having your baby stay with you in your room (rooming in) can give you time to get to know your  baby. Rooming in can also help you become comfortable caring for your baby. Breastfeeding can also help you bond with your baby. How can I plan for returning home with my baby?  Make sure that you have a car seat installed in your vehicle. ? Your car seat should be checked by a certified car seat installer to make sure that it is installed safely. ? Make sure that your baby fits into the car seat safely.  Ask your health care provider any questions you have about caring for yourself or your baby. Make sure that you are able to contact your health care provider with any questions after leaving the hospital. This information is not intended to replace advice given to you by your health care provider. Make sure you discuss any questions you have with your health care provider. Document Released: 11/25/2006 Document Revised: 07/03/2015 Document Reviewed: 01/02/2015 Elsevier Interactive Patient Education  [employer]2018 Elsevier Inc.

## 2016-08-12 NOTE — Discharge Summary (Signed)
OB Discharge Summary     Patient Name: Robin Craig DOB: 26-Feb-1995 MRN: [id number]  Date of admission: 08/09/2016 Delivering MD: Sherre ScarletWILLIAMS, KIMBERLY   Date of discharge: 08/12/2016  Admitting diagnosis: 39WKS CONTRACTIONS 1-2 MINS Intrauterine pregnancy: 6267w4d     Secondary diagnosis:  Principal Problem:   Vaginal delivery  Additional problems: None     Discharge diagnosis: Term Pregnancy Delivered                                                                                                Post partum procedures:None  Augmentation: None  Complications: None  Hospital course:  Onset of Labor With Vaginal Delivery     21 y.o. yo [id number] at 2867w4d was admitted in Active Labor on 08/09/2016. Patient had an uncomplicated labor course as follows:  Membrane Rupture Time/Date: 10:00 PM ,08/09/2016   Intrapartum Procedures: Episiotomy: None [1]                                         Lacerations:  None [1]  Patient had a delivery of a Viable infant. 08/10/2016  Information for the patient's newborn:  Robin Craig, Boy Robin Craig [[id number]]  Delivery Method: Vag-Spont    Pateint had an uncomplicated postpartum course.  She is ambulating, tolerating a regular diet, passing flatus, and urinating well. Patient is discharged home in stable condition on 08/12/16.   Physical exam  Vitals:   08/10/16 2058 08/11/16 0555 08/11/16 1755 08/12/16 0534  BP: 119/72 (!) 97/56 119/74 101/72  Pulse: 71 68 82 69  Resp: 14 16 18 16   Temp: 98.8 F (37.1 C) 97.6 F (36.4 C) 98.5 F (36.9 C) 98.2 F (36.8 C)  TempSrc: Oral Oral Oral Oral  SpO2:   100%   Weight:      Height:       General: alert Lochia: appropriate Uterine Fundus: firm Incision: Perineum clear DVT Evaluation: No evidence of DVT seen on physical exam. Labs: CBC Latest Ref Rng & Units 08/11/2016 08/10/2016 07/06/2016  WBC 4.0 - 10.5 K/uL 13.6(H) 13.1(H) 10.6(H)  Hemoglobin 12.0 - 15.0 g/dL 69.612.5 29.512.9 11.6(L)  Hematocrit 36.0 - 46.0 %  37.1 36.7 34.1(L)  Platelets 150 - 400 K/uL 185 184 160   CMP Latest Ref Rng & Units 07/06/2016  Glucose 65 - 99 mg/dL 72  BUN 6 - 20 mg/dL 7  Creatinine 2.840.44 - 1.321.00 mg/dL 4.400.56  Sodium 102135 - 725145 mmol/L 136  Potassium 3.5 - 5.1 mmol/L 3.9  Chloride 101 - 111 mmol/L 106  CO2 22 - 32 mmol/L 26  Calcium 8.9 - 10.3 mg/dL 3.6(U8.7(L)  Total Protein 6.5 - 8.1 g/dL 5.9(L)  Total Bilirubin 0.3 - 1.2 mg/dL 0.3  Alkaline Phos 38 - 126 U/L 137(H)  AST 15 - 41 U/L 22  ALT 14 - 54 U/L 15    Discharge instruction: per After Visit Summary and "Baby and Me Booklet".  After visit meds:  Allergies as of 08/12/2016      Reactions  Flagyl [metronidazole] Hives, Other (See Comments)   Reaction:  Dizziness       Medication List    TAKE these medications   FLINSTONES GUMMIES OMEGA-3 DHA Chew Chew 2 each by mouth daily.   ibuprofen 600 MG tablet Commonly known as:  ADVIL,MOTRIN Take 1 tablet (600 mg total) by mouth every 6 (six) hours as needed.       Diet: routine diet  Activity: Advance as tolerated. Pelvic rest for 6 weeks.   Outpatient follow up: 5 weeks, to prep for IUD at 6 weeks. Follow up Appt:No future appointments. Follow up Visit:No Follow-up on file.  Postpartum contraception: IUD Mirena at 6 weeks.  Newborn Data: Live born female  Birth Weight: 6 lb 8.2 oz (2954 g) APGAR: 9, 9  Baby Feeding: Breast Disposition:home with mother   08/12/2016 Nigel Bridgeman, CNM

## 2016-08-12 NOTE — Lactation Note (Signed)
This note was copied from a baby's chart. Lactation Consultation Note  Patient Name: Robin Wilhelmina Mcardlebony Miller WUJWJ'XToday's Date: 08/12/2016   Visited with Mom on day of discharge, baby 3554 hrs old.  Baby at 4% weight loss. Mom states breastfeeding is going much better, her RN yesterday assisted in teaching and she is latching baby much deeper onto breast.  Pumped a couple times, and fed baby colostrum.  Talked about focusing on frequent breastfeeds rather than a lot of pumping.   Recommended STS, and cue based feedings, 8-12 feedings per 24 hrs. Engorgement prevention and treatment discussed Mom aware of OP lactation services available to her.  Encouraged her to call prn.   Robin Craig, Robin Craig 08/12/2016, 9:33 AM

## 2016-08-12 NOTE — Plan of Care (Signed)
Problem: Nutritional: Goal: Mothers verbalization of comfort with breastfeeding process will improve Outcome: Completed/Met Date Met: 08/12/16 MOB stated that her breasts and nipples are not hurting today, like they were yesterday.  She has been hand expressing and applying coconut oil after feedings.

## 2016-08-17 ENCOUNTER — Encounter (HOSPITAL_COMMUNITY): Payer: Self-pay | Admitting: Emergency Medicine

## 2016-08-17 ENCOUNTER — Emergency Department (HOSPITAL_COMMUNITY)
Admission: EM | Admit: 2016-08-17 | Discharge: 2016-08-17 | Disposition: A | Discharge status: Home / Self Care | Attending: Emergency Medicine | Admitting: Emergency Medicine

## 2016-08-17 DIAGNOSIS — O9123 Nonpurulent mastitis associated with lactation: Secondary | ICD-10-CM | POA: Diagnosis not present

## 2016-08-17 DIAGNOSIS — N189 Chronic kidney disease, unspecified: Secondary | ICD-10-CM | POA: Diagnosis not present

## 2016-08-17 DIAGNOSIS — N61 Mastitis without abscess: Secondary | ICD-10-CM

## 2016-08-17 DIAGNOSIS — R509 Fever, unspecified: Secondary | ICD-10-CM | POA: Diagnosis present

## 2016-08-17 DIAGNOSIS — R51 Headache: Secondary | ICD-10-CM | POA: Insufficient documentation

## 2016-08-17 LAB — COMPREHENSIVE METABOLIC PANEL
ALBUMIN: 3 g/dL — AB (ref 3.5–5.0)
ALK PHOS: 138 U/L — AB (ref 38–126)
ALT: 51 U/L (ref 14–54)
AST: 38 U/L (ref 15–41)
Anion gap: 9 (ref 5–15)
BILIRUBIN TOTAL: 0.8 mg/dL (ref 0.3–1.2)
BUN: 9 mg/dL (ref 6–20)
CALCIUM: 9.6 mg/dL (ref 8.9–10.3)
CO2: 22 mmol/L (ref 22–32)
CREATININE: 0.77 mg/dL (ref 0.44–1.00)
Chloride: 105 mmol/L (ref 101–111)
GFR calc Af Amer: >60 mL/min (ref >60)
GFR calc non Af Amer: >60 mL/min (ref >60)
GLUCOSE: 97 mg/dL (ref 65–99)
Potassium: 3 mmol/L — ABNORMAL LOW (ref 3.5–5.1)
SODIUM: 136 mmol/L (ref 135–145)
TOTAL PROTEIN: 6.7 g/dL (ref 6.5–8.1)

## 2016-08-17 LAB — CBC WITH DIFFERENTIAL/PLATELET
BASOS ABS: 0 10*3/uL (ref 0.0–0.1)
BASOS PCT: 0 %
EOS ABS: 0 10*3/uL (ref 0.0–0.7)
Eosinophils Relative: 0 %
HCT: 41.8 % (ref 36.0–46.0)
HEMOGLOBIN: 14.1 g/dL (ref 12.0–15.0)
LYMPHS ABS: 0.9 10*3/uL (ref 0.7–4.0)
LYMPHS PCT: 5 %
MCH: 29.9 pg (ref 26.0–34.0)
MCHC: 33.7 g/dL (ref 30.0–36.0)
MCV: 88.7 fL (ref 78.0–100.0)
MONO ABS: 0.7 10*3/uL (ref 0.1–1.0)
Monocytes Relative: 4 %
NEUTROS ABS: 16.9 10*3/uL — AB (ref 1.7–7.7)
Neutrophils Relative %: 91 %
Platelets: 238 10*3/uL (ref 150–400)
RBC: 4.71 MIL/uL (ref 3.87–5.11)
RDW: 14.4 % (ref 11.5–15.5)
WBC: 18.5 10*3/uL — ABNORMAL HIGH (ref 4.0–10.5)

## 2016-08-17 LAB — URINALYSIS, ROUTINE W REFLEX MICROSCOPIC
Bacteria, UA: NONE SEEN
Bilirubin Urine: NEGATIVE
GLUCOSE, UA: NEGATIVE mg/dL
Ketones, ur: NEGATIVE mg/dL
Leukocytes, UA: NEGATIVE
NITRITE: NEGATIVE
PROTEIN: NEGATIVE mg/dL
SPECIFIC GRAVITY, URINE: 1.006 (ref 1.005–1.030)
pH: 6 (ref 5.0–8.0)

## 2016-08-17 LAB — I-STAT CG4 LACTIC ACID, ED: Lactic Acid, Venous: 1.73 mmol/L (ref 0.5–1.9)

## 2016-08-17 MED ORDER — ACETAMINOPHEN 325 MG PO TABS
650.0000 mg | ORAL_TABLET | Freq: Once | ORAL | Status: AC
  Administered 2016-08-17: 650 mg via ORAL
  Filled 2016-08-17: qty 2

## 2016-08-17 MED ORDER — SODIUM CHLORIDE 0.9 % IV BOLUS (SEPSIS)
1000.0000 mL | Freq: Once | INTRAVENOUS | Status: AC
  Administered 2016-08-17: 1000 mL via INTRAVENOUS

## 2016-08-17 MED ORDER — CEPHALEXIN 250 MG PO CAPS
500.0000 mg | ORAL_CAPSULE | Freq: Once | ORAL | Status: AC
  Administered 2016-08-17: 500 mg via ORAL
  Filled 2016-08-17: qty 2

## 2016-08-17 MED ORDER — VANCOMYCIN HCL 10 G IV SOLR
1250.0000 mg | Freq: Once | INTRAVENOUS | Status: DC
  Filled 2016-08-17: qty 1250

## 2016-08-17 MED ORDER — SODIUM CHLORIDE 0.9 % IV BOLUS (SEPSIS): 250.0000 mL | Freq: Once | INTRAVENOUS | Status: DC

## 2016-08-17 MED ORDER — CEPHALEXIN 500 MG PO CAPS: 500.0000 mg | ORAL_CAPSULE | Freq: Four times a day (QID) | ORAL | 0 refills | Status: AC

## 2016-08-17 NOTE — ED Notes (Signed)
Patient is alert and orientedx4.  Patient was explained discharge instructions and they understood them with no questions.   

## 2016-08-17 NOTE — Progress Notes (Signed)
Pharmacy Antibiotic Note  Robin Craig is a 21 y.o. female admitted on 08/17/2016 with sepsis and cellulitis.  Pharmacy has been consulted for Vancomycin dosing. Patient is 8 days post-partum and breastfeeding with R-breast pain with redness and swelling. Also complains of frontal headache. MD to ultrasound breast. Allergy noted to Flagyl but no other medicines. MD suspects patient dehydrated and fluid resuscitation in place.   Plan: Vancomycin 1250mg  IV x1 now then follow-up labs for further dosing.  Discussed with MD the potential to add Cefepime if concerned for GN organisms but decision was made to hold off for now.   Height: 5\' 4"  (162.6 cm) Weight: 160 lb (72.6 kg) IBW/kg (Calculated) : 54.7  Temp (24hrs), Avg:102.8 F (39.3 C), Min:102.8 F (39.3 C), Max:102.8 F (39.3 C)   Recent Labs Lab 08/11/16 0511 08/17/16 0942  WBC 13.6*  --   LATICACIDVEN  --  1.73    CrCl cannot be calculated (Patient's most recent lab result is older than the maximum 21 days allowed.).    Allergies  Allergen Reactions  . Flagyl [Metronidazole] Hives and Other (See Comments)    Dizziness     Antimicrobials this admission: Vancomycin 7/7 >>  Dose adjustments this admission:   Microbiology results: 7/7 BCx:   Thank you for allowing pharmacy to be a part of this patient's care.  Link SnufferJessica Lavinia Mcneely, PharmD, BCPS Clinical Pharmacist Clinical Phone 08/17/2016 until 3:30 PM - [phone] After hours, please call #[phone] 08/17/2016 9:46 AM

## 2016-08-17 NOTE — ED Notes (Signed)
Family at bedside. 

## 2016-08-17 NOTE — Discharge Instructions (Signed)
Infective lactational mastitis is an infection of the breast associated with pain, redness, fever, myalgia, and malaise that occurs in the setting of breastfeeding. It is most common during the first six weeks postpartum.   - Please take Keflex 4 times a day (every 6 hours) for 7 days.  - You can take 650 mg of Tylenol every 4-6 hours as needed for fever control. Please do not take more than 3000 mg of Tylenol total and a 24-hour period because it can impair your liver. - If you still have a fever despite taking Tylenol after you have been taking the antibiotics for 24 hours, please follow-up with your OB/GYN. - If you develop new symptoms or worsening symptoms, please return to the emergency department for reevaluation. - Please make sure to drink plenty of liquids while your breast-feeding to avoid dehydration.

## 2016-08-17 NOTE — ED Provider Notes (Signed)
[facility] Provider Note   CSN: [id number] Arrival date & time: 08/17/16  [id number]     History   Chief Complaint Chief Complaint  Patient presents with  . Fever    Fever that started yesterday along with Left breast pain.  Patient denied any other symptoms.    HPI Robin Craig is a 21 y.o. female who presents to the emergency department with a throbbing, bilateral frontal headache that began last night subjective fever and chills this morning. She reports she is 8 days postpartum. She also reports that her left breast has become painful and tender since yesterday and that her son stopped latching to the left breast last night although it continued to produce milk. She denies abdominal pain, N/V/D, CP, pelvic pain dyspnea, dysuria, vaginal discharge. She reports 2 days of heavy bleeding since giving birth, but the problem has since resolved. She was given Tylenol in route by EMS. Treatment prior to arrival has included warm compresses without improvement.   The history is provided by the patient. No language interpreter was used.    Past Medical History:  Diagnosis Date  . Chlamydia   . Chronic kidney disease    kidney stones  . GC (gonococcus infection)   . UTI (urinary tract infection)   . Yeast vaginitis     Patient Active Problem List   Diagnosis Date Noted  . Vaginal delivery 08/10/2016  . History of kidney stones--early pregnancy 07/05/2016    Past Surgical History:  Procedure Laterality Date  . NO PAST SURGERIES    . WISDOM TOOTH EXTRACTION  2010    OB History    Gravida Para Term Preterm AB Living   3 2 2   1 2    SAB TAB Ectopic Multiple Live Births   1     0 2       Home Medications    Prior to Admission medications   Medication Sig Start Date End Date Taking? Authorizing Provider  cephALEXin (KEFLEX) 500 MG capsule Take 1 capsule (500 mg total) by mouth 4 (four) times daily. 08/17/16 08/24/16  [place], Coral ElseMia A, PA-C    Family History Family  History  Problem Relation Age of Onset  . Asthma Sister   . Cancer Neg Hx   . Diabetes Neg Hx   . Hypertension Neg Hx     Social History Social History  Substance Use Topics  . Smoking status: Never Smoker  . Smokeless tobacco: Never Used  . Alcohol use No     Allergies   Flagyl [metronidazole]   Review of Systems Review of Systems  Constitutional: Negative for activity change, chills and fever.  Respiratory: Negative for shortness of breath.   Cardiovascular: Positive for chest pain (Left breast). Negative for leg swelling.  Gastrointestinal: Negative for abdominal distention, abdominal pain, diarrhea, nausea and vomiting.  Genitourinary: Negative for dysuria, pelvic pain, urgency, vaginal discharge and vaginal pain.  Musculoskeletal: Negative for back pain.  Skin: Negative for rash.  Neurological: Positive for headaches. Negative for dizziness and light-headedness.   Physical Exam Updated Vital Signs BP 106/76   Pulse 92   Temp (!) 102.8 F (39.3 C) (Oral)   Resp (!) 21   Ht 5\' 4"  (1.626 m)   Wt 63.5 kg (140 lb)   SpO2 98%   Breastfeeding? Yes   BMI 24.03 kg/m   Physical Exam  Constitutional: No distress.  HENT:  Head: Normocephalic.  Eyes: Conjunctivae are normal.  Neck: Neck supple.  Full range  of motion with flexion, lateral flexion, extension, and rotation of the neck. No meningismus  Cardiovascular: Normal rate, regular rhythm, normal heart sounds and intact distal pulses.  Exam reveals no gallop and no friction rub.   No murmur heard. Pulmonary/Chest: Effort normal. No respiratory distress. She has no wheezes. She has no rales.  Left breast is mild erythematous, swollen and warm.   Abdominal: Soft. Bowel sounds are normal. She exhibits no distension. There is no tenderness. There is no rebound and no guarding.  Musculoskeletal: Normal range of motion. She exhibits no edema, tenderness or deformity.  Neurological: She is alert.  Skin: Skin is warm.  No rash noted.  Psychiatric: Her behavior is normal.  Nursing note and vitals reviewed.  ED Treatments / Results  Labs (all labs ordered are listed, but only abnormal results are displayed) Labs Reviewed  COMPREHENSIVE METABOLIC PANEL - Abnormal; Notable for the following:       Result Value   Potassium 3.0 (*)    Albumin 3.0 (*)    Alkaline Phosphatase 138 (*)    All other components within normal limits  CBC WITH DIFFERENTIAL/PLATELET - Abnormal; Notable for the following:    WBC 18.5 (*)    Neutro Abs 16.9 (*)    All other components within normal limits  URINALYSIS, ROUTINE W REFLEX MICROSCOPIC - Abnormal; Notable for the following:    Color, Urine STRAW (*)    Hgb urine dipstick SMALL (*)    Squamous Epithelial / LPF 0-5 (*)    All other components within normal limits  CULTURE, BLOOD (ROUTINE X 2)  CULTURE, BLOOD (ROUTINE X 2)  I-STAT CG4 LACTIC ACID, ED    EKG  EKG Interpretation  Date/Time:  Saturday August 17 2016 09:44:06 EDT Ventricular Rate:  112 PR Interval:    QRS Duration: 83 QT Interval:  344 QTC Calculation: 470 R Axis:   44 Text Interpretation:  Sinus tachycardia RSR' in V1 or V2, right VCD or RVH no STEMI   No old tracing to compare Confirmed by Drema Pry [phone]) on 08/17/2016 11:56:55 AM       Radiology No results found.  Procedures Procedures (including critical care time)  Medications Ordered in ED Medications  sodium chloride 0.9 % bolus 1,000 mL (0 mLs Intravenous Stopped 08/17/16 1134)    And  sodium chloride 0.9 % bolus 1,000 mL (0 mLs Intravenous Stopped 08/17/16 1134)  acetaminophen (TYLENOL) tablet 650 mg (650 mg Oral Given 08/17/16 1002)  acetaminophen (TYLENOL) tablet 650 mg (650 mg Oral Given 08/17/16 1344)  cephALEXin (KEFLEX) capsule 500 mg (500 mg Oral Given 08/17/16 1344)     Initial Impression / Assessment and Plan / ED Course  I have reviewed the triage vital signs and the nursing notes.  Pertinent labs & imaging results that  were available during my care of the patient were reviewed by me and considered in my medical decision making (see chart for details).     21 year old female patient who is 8-days post-partum presenting with mastitis. The patient was seen and evaluated with Dr. Eudelia Bunch, attending physician. Febrile to 102.8. Tachycardic in the 120s. Leukocytosis of 18.5. PE is unremarkable for abdominal or pain or other findings consistent with postpartum uterine infection. UA not concerning for infection. Lactate 1.73. Tachycardia improved with IVF. Consulted OBGYN in regards to patient's postpartum status and concern for mastitis versus underlying postpartum uterine infection The on-call midwife Bernerd Pho who does not recommend any further imaging or workup at this time  and states that mastitis can present with flu-like symptoms. Recommended Kelfex 500mg  for 7 days and follow-up to OBGYN if fever is present after 24-hours of ABX. Strict return precuations given. VSS improved. NAD. The patient is safe and stable for d/c at this time.   Final Clinical Impressions(s) / ED Diagnoses   Final diagnoses:  Acute mastitis of left breast    New Prescriptions Discharge Medication List as of 08/17/2016  1:29 PM    START taking these medications   Details  cephALEXin (KEFLEX) 500 MG capsule Take 1 capsule (500 mg total) by mouth 4 (four) times daily., Starting Sat 08/17/2016, Until Sat 08/24/2016, Print         Terrie Haring A, PA-C 08/19/16 1444

## 2016-08-17 NOTE — ED Triage Notes (Addendum)
The patient advised she has been having fever since yesterday along with chills.  EMS gave her 1gm of tylenol and  brought her to the ED to be evaluated.  She is recently  Post partum 8days and is breastfeeding.  Patient complaining of left breast pain with redness and hot to the touch.  Rates pain 8/10.  Patient is alert and oriented X4.

## 2016-08-17 NOTE — ED Provider Notes (Signed)
Medical screening examination/treatment/procedure(s) were conducted as a shared visit with non-physician practitioner(s) and myself.  I personally evaluated the patient during the encounter. Briefly, the patient is a 21 y.o. female who presented to the ED with fever and left breast pain. Patient is 8 days postpartum. Presentation is consistent with mastoiditis. Labs with leukocytosis. No other sources of infection noted on exam. No historical or exam findings that would be consistent with postpartum uterine infection. Case discussed with OB who agreed and recommended icing the patient on Keflex. She is to follow-up with OB in 2 days if she doesn't improve.  The patient is safe for discharge with strict return precautions.      Nira Connardama, Anael Rosch Eduardo, MD 08/17/16 [phone]

## 2016-08-22 LAB — CULTURE, BLOOD (ROUTINE X 2)
CULTURE: NO GROWTH
Culture: NO GROWTH
SPECIAL REQUESTS: ADEQUATE
SPECIAL REQUESTS: ADEQUATE

## 2016-11-25 ENCOUNTER — Encounter (HOSPITAL_COMMUNITY): Payer: Self-pay | Admitting: Emergency Medicine

## 2016-11-25 ENCOUNTER — Ambulatory Visit (HOSPITAL_COMMUNITY)
Admission: EM | Admit: 2016-11-25 | Discharge: 2016-11-25 | Disposition: A | Discharge status: Home / Self Care | Attending: Emergency Medicine | Admitting: Emergency Medicine

## 2016-11-25 DIAGNOSIS — R197 Diarrhea, unspecified: Secondary | ICD-10-CM | POA: Diagnosis not present

## 2016-11-25 DIAGNOSIS — Z3201 Encounter for pregnancy test, result positive: Secondary | ICD-10-CM | POA: Diagnosis not present

## 2016-11-25 DIAGNOSIS — R110 Nausea: Secondary | ICD-10-CM | POA: Diagnosis not present

## 2016-11-25 DIAGNOSIS — Z3490 Encounter for supervision of normal pregnancy, unspecified, unspecified trimester: Secondary | ICD-10-CM

## 2016-11-25 LAB — POCT PREGNANCY, URINE: Preg Test, Ur: POSITIVE — AB

## 2016-11-25 MED ORDER — PRENATAL COMPLETE 14-0.4 MG PO TABS: 1.0000 | ORAL_TABLET | Freq: Every day | ORAL | 0 refills | Status: AC

## 2016-11-25 MED ORDER — DOXYLAMINE-PYRIDOXINE 10-10 MG PO TBEC: 2.0000 | DELAYED_RELEASE_TABLET | Freq: Every day | ORAL | 0 refills | Status: DC

## 2016-11-25 NOTE — ED Triage Notes (Signed)
Pt sts nausea and some diarrhea x 1 week after eating a burger

## 2016-11-25 NOTE — Discharge Instructions (Signed)
When you obtain the stool sample, bring it back here tomorrow morning so that we can analyze it. If you have an infection that would require antibiotics, we will place on the appropriate antibiotics at that time. Otherwise, bland diet. Take the dicelgis as needed for nausea. The prenatal vitamins will also help with any diarrhea. Follow-up with your OB/GYN as soon as possible.

## 2016-11-25 NOTE — ED Provider Notes (Addendum)
HPI  SUBJECTIVE:  Robin Craig is a 21 y.o. female who presents with nausea and watery nonbloody diarrhea after eating undercooked burger 8 days ago. Reports approximately 4 episodes of diarrhea per day, primarily after eating. She states the nausea occurs 15-30 minutes after eating. She denies any other nausea during the day. She states that she feels "bloated". States that she had a fever the beginning of the illness Tmax 100.5, but has not had any fever in 6 days. No abdominal pain, distention, change in urine output, lightheadedness, syncope. No travel, other bra are undercooked foods, sick contacts, recent antibiotics. Patient has not been camping recently. No antipyretic in the past 6-8 hours. She tried Advil without improvement in her symptoms. No alleviating factors. Symptoms are worse with eating. She reports occasional alcohol use, no history of gallbladder disease, pancreatitis, peptic ulcer disease, diabetes, hypertension. LMP: June. She is postpartum, delivering on June 30. She is currently breast-feeding. She is currently in a same sex relationship with another female but  had intercourse with a female after giving birth. PMD: [facility]   Past Medical History:  Diagnosis Date  . Chlamydia   . Chronic kidney disease    kidney stones  . GC (gonococcus infection)   . UTI (urinary tract infection)   . Yeast vaginitis     Past Surgical History:  Procedure Laterality Date  . NO PAST SURGERIES    . WISDOM TOOTH EXTRACTION  2010    Family History  Problem Relation Age of Onset  . Asthma Sister   . Cancer Neg Hx   . Diabetes Neg Hx   . Hypertension Neg Hx     Social History  Substance Use Topics  . Smoking status: Never Smoker  . Smokeless tobacco: Never Used  . Alcohol use No    No current facility-administered medications for this encounter.  No current outpatient prescriptions on file.  Allergies  Allergen Reactions  . Flagyl [Metronidazole] Hives and Other (See  Comments)    Dizziness      ROS  As noted in HPI.   Physical Exam  BP 117/76 (BP Location: Right Arm)   Pulse 73   Temp 99 F (37.2 C) (Oral)   Resp 18   SpO2 100%   Constitutional: Well developed, well nourished, no acute distress Eyes:  EOMI, conjunctiva normal bilaterally HENT: Normocephalic, atraumatic,mucus membranes moist Respiratory: Normal inspiratory effort. lungs clear bilaterally. Cardiovascular: Normal rate, regular rhythm no murmurs rubs gallops GI: . Normal appearance. Soft, nontender, nondistended, no rebound or guarding. skin: No rash, skin intact Musculoskeletal: no deformities Neurologic: Alert & oriented x 3, no focal neuro deficits Psychiatric: Speech and behavior appropriate   ED Course   Medications - No data to display  Orders Placed This Encounter  Procedures  . Gastrointestinal Pathogen Panel PCR  . Pregnancy, urine POC    Standing Status:   Standing    Number of Occurrences:   1    Results for orders placed or performed during the hospital encounter of 11/25/16 (from the past 24 hour(s))  Pregnancy, urine POC     Status: Abnormal   Collection Time: 11/25/16  8:23 PM  Result Value Ref Range   Preg Test, Ur POSITIVE (A) NEGATIVE   No results found.  ED Clinical Impression  No diagnosis found.   ED Assessment/Plan  Urine pregnancy positive. Discussed this with patient. We'll send her home with some prenatal vitamins and she can follow-up with her OB/GYN at [facility].Marland Kitchen  No evidence of surgical abdomen, dehydration.Placed a gastrointestinal pathogen panel by PCR as a future order. Patient states that she is unable to give [place] a stool sample here in the clinic. We'll have her obtain a sample at home and bring it back tomorrow. We'll defer treatment pending culture results. Also plan to send home with diclegis to help with the nausea and diarrhea. Advised that Unisom and vitamin B6 will work if the diclegis is too  expensive.  Discussed labs,  MDM, plan and followup with patient. Patient agrees with plan.   No orders of the defined types were placed in this encounter.   *This clinic note was created using Dragon dictation software. Therefore, there may be occasional mistakes despite careful proofreading.  ?   Domenick Gong, MD 11/26/16 8756    Domenick Gong, MD 11/26/16 [phone]

## 2016-12-16 ENCOUNTER — Ambulatory Visit (HOSPITAL_COMMUNITY)
Admission: EM | Admit: 2016-12-16 | Discharge: 2016-12-16 | Disposition: A | Discharge status: Home / Self Care | Attending: Emergency Medicine | Admitting: Emergency Medicine

## 2016-12-16 ENCOUNTER — Encounter (HOSPITAL_COMMUNITY): Payer: Self-pay | Admitting: Emergency Medicine

## 2016-12-16 DIAGNOSIS — H1031 Unspecified acute conjunctivitis, right eye: Secondary | ICD-10-CM

## 2016-12-16 MED ORDER — POLYMYXIN B-TRIMETHOPRIM 10000-0.1 UNIT/ML-% OP SOLN: 1.0000 [drp] | OPHTHALMIC | 0 refills | Status: DC

## 2016-12-16 NOTE — ED Provider Notes (Signed)
[facility]    CSN: [id number] Arrival date & time: 12/16/16  1735     History   Chief Complaint Chief Complaint  Patient presents with  . Eye Pain    HPI Robin Craig is a 21 y.o. female.   21 year old female complaining of right itchy red eye with clear drainage for 2 days. No trauma. No sensation of foreign body. Nothing makes it better. Nothing makes it worse. No complaints with the left eye.      Past Medical History:  Diagnosis Date  . Chlamydia   . Chronic kidney disease    kidney stones  . GC (gonococcus infection)   . UTI (urinary tract infection)   . Yeast vaginitis     Patient Active Problem List   Diagnosis Date Noted  . Vaginal delivery 08/10/2016  . History of kidney stones--early pregnancy 07/05/2016    Past Surgical History:  Procedure Laterality Date  . NO PAST SURGERIES    . WISDOM TOOTH EXTRACTION  2010    OB History    Gravida Para Term Preterm AB Living   3 2 2   1 2    SAB TAB Ectopic Multiple Live Births   1     0 2       Home Medications    Prior to Admission medications   Medication Sig Start Date End Date Taking? Authorizing Provider  Doxylamine-Pyridoxine 10-10 MG TBEC Take 2 tablets by mouth at bedtime. 11/25/16   Domenick GongMortenson, Ashley, MD  Prenatal Vit-Fe Fumarate-FA (PRENATAL COMPLETE) 14-0.4 MG TABS Take 1 tablet by mouth daily. 11/25/16 12/25/16  Domenick GongMortenson, Ashley, MD  trimethoprim-polymyxin b (POLYTRIM) ophthalmic solution Place 1 drop every 4 (four) hours into the right eye. 12/16/16   Hayden RasmussenMabe, Naama S[place], NP    Family History Family History  Problem Relation Age of Onset  . Asthma Sister   . Cancer Neg Hx   . Diabetes Neg Hx   . Hypertension Neg Hx     Social History Social History   Tobacco Use  . Smoking status: Never Smoker  . Smokeless tobacco: Never Used  Substance Use Topics  . Alcohol use: No  . Drug use: No     Allergies   Flagyl [metronidazole]   Review of Systems Review of Systems    Constitutional: Negative.   HENT: Negative.   Eyes: Positive for discharge, redness and itching.  Respiratory: Negative.   All other systems reviewed and are negative.    Physical Exam Triage Vital Signs ED Triage Vitals [12/16/16 1825]  Enc Vitals Group     BP 114/73     Pulse Rate 68     Resp 18     Temp 98.6 F (37 C)     Temp Source Oral     SpO2 99 %     Weight      Height      Head Circumference      Peak Flow      Pain Score 5     Pain Loc      Pain Edu?      Excl. in GC?    No data found.  Updated Vital Signs BP 114/73 (BP Location: Right Arm)   Pulse 68   Temp 98.6 F (37 C) (Oral)   Resp 18   SpO2 99%   Visual Acuity Right Eye Distance:   Left Eye Distance:   Bilateral Distance:    Right Eye Near:   Left Eye Near:  Bilateral Near:     Physical Exam  Constitutional: She is oriented to person, place, and time. She appears well-developed and well-nourished. No distress.  HENT:  Head: Normocephalic and atraumatic.  Eyes: EOM are normal. Pupils are equal, round, and reactive to light.  Right upper and lower conjunctiva erythematous. Minor erythema to the sclera. Anterior chamber clear. Normal pupillary reaction to light. EOMI.  Neck: Normal range of motion. Neck supple.  Pulmonary/Chest: Effort normal.  Neurological: She is alert and oriented to person, place, and time.  Skin: Skin is warm and dry.  Nursing note and vitals reviewed.    UC Treatments / Results  Labs (all labs ordered are listed, but only abnormal results are displayed) Labs Reviewed - No data to display  EKG  EKG Interpretation None       Radiology No results found.  Procedures Procedures (including critical care time)  Medications Ordered in UC Medications - No data to display   Initial Impression / Assessment and Plan / UC Course  I have reviewed the triage vital signs and the nursing notes.  Pertinent labs & imaging results that were available during my  care of the patient were reviewed by me and considered in my medical decision making (see chart for details).    Use warm moist compresses frequently. At the drugstore pick up a bottle of Zaditor eyedrops to help with itching and discomfort.    Final Clinical Impressions(s) / UC Diagnoses   Final diagnoses:  Acute conjunctivitis of right eye, unspecified acute conjunctivitis type    ED Discharge Orders        Ordered    trimethoprim-polymyxin b (POLYTRIM) ophthalmic solution  Every 4 hours     12/16/16 2013       Controlled Substance Prescriptions [place] Controlled Substance Registry consulted? Not Applicable   Hayden Rasmussen, NP 12/16/16 2015

## 2016-12-16 NOTE — ED Triage Notes (Signed)
Pt sts right eye redness and itching

## 2016-12-16 NOTE — Discharge Instructions (Signed)
Use warm moist compresses frequently. At the drugstore pick up a bottle of Zaditor eyedrops to help with itching and discomfort.

## 2017-10-13 ENCOUNTER — Encounter (HOSPITAL_COMMUNITY): Payer: Self-pay

## 2017-10-13 ENCOUNTER — Ambulatory Visit (HOSPITAL_COMMUNITY)
Admission: EM | Admit: 2017-10-13 | Discharge: 2017-10-13 | Disposition: A | Discharge status: Home / Self Care | Attending: Family Medicine | Admitting: Family Medicine

## 2017-10-13 DIAGNOSIS — N898 Other specified noninflammatory disorders of vagina: Secondary | ICD-10-CM | POA: Diagnosis present

## 2017-10-13 DIAGNOSIS — N760 Acute vaginitis: Secondary | ICD-10-CM | POA: Insufficient documentation

## 2017-10-13 DIAGNOSIS — Z87442 Personal history of urinary calculi: Secondary | ICD-10-CM | POA: Insufficient documentation

## 2017-10-13 DIAGNOSIS — N189 Chronic kidney disease, unspecified: Secondary | ICD-10-CM | POA: Diagnosis not present

## 2017-10-13 MED ORDER — METRONIDAZOLE 0.75 % VA GEL: 1.0000 | Freq: Every day | VAGINAL | 0 refills | Status: AC

## 2017-10-13 NOTE — Discharge Instructions (Signed)
Please use metronidazole applicator at bedtime, then twice weekly; may try probiotics  We are testing you for Gonorrhea, Chlamydia, Trichomonas, Yeast and Bacterial Vaginosis. We will call you if anything is positive and let you know if you require any further treatment. Please inform partners of any positive results.   Please return if symptoms not improving with treatment, development of fever, nausea, vomiting, abdominal pain.

## 2017-10-13 NOTE — ED Triage Notes (Signed)
Pt presents with vaginal discharge and vaginally odor.

## 2017-10-14 LAB — CERVICOVAGINAL ANCILLARY ONLY
BACTERIAL VAGINITIS: POSITIVE — AB
CANDIDA VAGINITIS: NEGATIVE
CHLAMYDIA, DNA PROBE: NEGATIVE
NEISSERIA GONORRHEA: NEGATIVE
Trichomonas: NEGATIVE

## 2017-10-15 NOTE — ED Provider Notes (Signed)
[facility]    CSN: [id number] Arrival date & time: 10/13/17  1957     History   Chief Complaint Chief Complaint  Patient presents with  . Vaginal Discharge    HPI Robin Craig is a 22 y.o. female history of BV presenting today for evaluation of vaginal discharge and odor.  Patient states that for the past week she has had increased discharge and odor.  She states that she has history of BV and symptoms will similar today.  Denies concern for STD.  Patient is sexually active with females.  Last menstrual period was middle of August, not on any form of birth control.  Denies associated itching or irritation.  Denies urinary symptoms of dysuria, increased frequency or urgency.  Denies fever, abdominal pain, nausea, vomiting, back pain.  HPI  Past Medical History:  Diagnosis Date  . Chlamydia   . Chronic kidney disease    kidney stones  . GC (gonococcus infection)   . UTI (urinary tract infection)   . Yeast vaginitis     Patient Active Problem List   Diagnosis Date Noted  . Vaginal delivery 08/10/2016  . History of kidney stones--early pregnancy 07/05/2016    Past Surgical History:  Procedure Laterality Date  . NO PAST SURGERIES    . WISDOM TOOTH EXTRACTION  2010    OB History    Gravida  3   Para  2   Term  2   Preterm      AB  1   Living  2     SAB  1   TAB      Ectopic      Multiple  0   Live Births  2            Home Medications    Prior to Admission medications   Medication Sig Start Date End Date Taking? Authorizing Provider  Doxylamine-Pyridoxine 10-10 MG TBEC Take 2 tablets by mouth at bedtime. 11/25/16   Domenick Gong, MD  metroNIDAZOLE (METROGEL VAGINAL) 0.75 % vaginal gel Place 1 Applicatorful vaginally at bedtime for 5 days. Then twice weekly after 5 days use 10/13/17 10/18/17  , [employer] C, PA-C  trimethoprim-polymyxin b (POLYTRIM) ophthalmic solution Place 1 drop every 4 (four) hours into the right eye. 12/16/16    Hayden Rasmussen, NP  Norethin Ace-Eth Estrad-FE 1-20 MG-MCG(24) TABS Take 1 tablet by mouth daily. 06/13/10 04/17/11  Ta, Cat, MD    Family History Family History  Problem Relation Age of Onset  . Asthma Sister   . Cancer Neg Hx   . Diabetes Neg Hx   . Hypertension Neg Hx     Social History Social History   Tobacco Use  . Smoking status: Never Smoker  . Smokeless tobacco: Never Used  Substance Use Topics  . Alcohol use: No  . Drug use: No     Allergies   Flagyl [metronidazole]   Review of Systems Review of Systems  Constitutional: Negative for fever.  Respiratory: Negative for shortness of breath.   Cardiovascular: Negative for chest pain.  Gastrointestinal: Negative for abdominal pain, diarrhea, nausea and vomiting.  Genitourinary: Positive for vaginal discharge. Negative for dysuria, flank pain, genital sores, hematuria, menstrual problem, vaginal bleeding and vaginal pain.  Musculoskeletal: Negative for back pain.  Skin: Negative for rash.  Neurological: Negative for dizziness, light-headedness and headaches.     Physical Exam Triage Vital Signs ED Triage Vitals  Enc Vitals Group     BP 10/13/17 2050  117/82     Pulse Rate 10/13/17 2050 76     Resp 10/13/17 2050 20     Temp 10/13/17 2050 98.8 F (37.1 C)     Temp Source 10/13/17 2050 Oral     SpO2 10/13/17 2050 98 %     Weight --      Height --      Head Circumference --      Peak Flow --      Pain Score 10/13/17 2048 0     Pain Loc --      Pain Edu? --      Excl. in GC? --    No data found.  Updated Vital Signs BP 117/82 (BP Location: Left Arm)   Pulse 76   Temp 98.8 F (37.1 C) (Oral)   Resp 20   LMP 10/02/2017   SpO2 98%   Visual Acuity Right Eye Distance:   Left Eye Distance:   Bilateral Distance:    Right Eye Near:   Left Eye Near:    Bilateral Near:     Physical Exam  Constitutional: She is oriented to person, place, and time. She appears well-developed and well-nourished.  No acute  distress  HENT:  Head: Normocephalic and atraumatic.  Nose: Nose normal.  Eyes: Conjunctivae are normal.  Neck: Neck supple.  Cardiovascular: Normal rate.  Pulmonary/Chest: Effort normal. No respiratory distress.  Abdominal: She exhibits no distension.  Musculoskeletal: Normal range of motion.  Neurological: She is alert and oriented to person, place, and time.  Skin: Skin is warm and dry.  Psychiatric: She has a normal mood and affect.  Nursing note and vitals reviewed.    UC Treatments / Results  Labs (all labs ordered are listed, but only abnormal results are displayed) Labs Reviewed  CERVICOVAGINAL ANCILLARY ONLY - Abnormal; Notable for the following components:      Result Value   Bacterial vaginitis **POSITIVE for Gardnerella vaginalis** (*)    All other components within normal limits    EKG None  Radiology No results found.  Procedures Procedures (including critical care time)  Medications Ordered in UC Medications - No data to display  Initial Impression / Assessment and Plan / UC Course  I have reviewed the triage vital signs and the nursing notes.  Pertinent labs & imaging results that were available during my care of the patient were reviewed by me and considered in my medical decision making (see chart for details).     Patient with vaginal discharge and history of BV, will go ahead and empirically treat for this with metronidazole p.o., given recurrent nature, will recommended patient did try probiotics, as well as may try MetroGel vaginally twice weekly.Discussed strict return precautions. Patient verbalized understanding and is agreeable with plan.  Final Clinical Impressions(s) / UC Diagnoses   Final diagnoses:  Vaginitis and vulvovaginitis     Discharge Instructions     Please use metronidazole applicator at bedtime, then twice weekly; may try probiotics  We are testing you for Gonorrhea, Chlamydia, Trichomonas, Yeast and Bacterial  Vaginosis. We will call you if anything is positive and let you know if you require any further treatment. Please inform partners of any positive results.   Please return if symptoms not improving with treatment, development of fever, nausea, vomiting, abdominal pain.    ED Prescriptions    Medication Sig Dispense Auth. Provider   metroNIDAZOLE (METROGEL VAGINAL) 0.75 % vaginal gel Place 1 Applicatorful vaginally at bedtime for 5 days. Then  twice weekly after 5 days use 70 g ,  C, PA-C     Controlled Substance Prescriptions [place] Controlled Substance Registry consulted? Not Applicable   Lew Dawes, [place] 10/15/17 [phone]

## 2018-01-28 ENCOUNTER — Encounter (HOSPITAL_COMMUNITY): Payer: Self-pay | Admitting: Emergency Medicine

## 2018-01-28 ENCOUNTER — Ambulatory Visit (HOSPITAL_COMMUNITY)
Admission: EM | Admit: 2018-01-28 | Discharge: 2018-01-28 | Disposition: A | Discharge status: Home / Self Care | Attending: Family Medicine | Admitting: Family Medicine

## 2018-01-28 DIAGNOSIS — N898 Other specified noninflammatory disorders of vagina: Secondary | ICD-10-CM | POA: Diagnosis not present

## 2018-01-28 MED ORDER — FLUCONAZOLE 150 MG PO TABS: 150.0000 mg | ORAL_TABLET | Freq: Every day | ORAL | 0 refills | Status: DC

## 2018-01-28 NOTE — Discharge Instructions (Signed)
Start diflucan as directed. Avoid itching/rubbing. Monitor for any worsening of symptoms, fever, abdominal pain, nausea, vomiting, to follow up for reevaluation.

## 2018-01-28 NOTE — ED Triage Notes (Signed)
PT reports vaginal itching and swelling that started yesterday.

## 2018-01-28 NOTE — ED Provider Notes (Signed)
[facility]    CSN: [id number] Arrival date & time: 01/28/18  1004     History   Chief Complaint Chief Complaint  Patient presents with  . Vaginal Itching    HPI Robin Craig is a 22 y.o. female.   22 year old female comes in for 2-day history of vaginal itching and irritation. States wore a new pair of pants without underwear. Started noticing itching yesterday that worsened this morning. Noticed labial swelling this morning after itching. No obvious discharge. Denies urinary frequency, dysuria, hematuria. Denies abdominal pain, nausea, vomiting. Denies fever, chills, night sweats. LMP 01/10/2018. Sexually active with one female partner.      Past Medical History:  Diagnosis Date  . Chlamydia   . Chronic kidney disease    kidney stones  . GC (gonococcus infection)   . UTI (urinary tract infection)   . Yeast vaginitis     Patient Active Problem List   Diagnosis Date Noted  . Vaginal delivery 08/10/2016  . History of kidney stones--early pregnancy 07/05/2016    Past Surgical History:  Procedure Laterality Date  . NO PAST SURGERIES    . WISDOM TOOTH EXTRACTION  2010    OB History    Gravida  3   Para  2   Term  2   Preterm      AB  1   Living  2     SAB  1   TAB      Ectopic      Multiple  0   Live Births  2            Home Medications    Prior to Admission medications   Medication Sig Start Date End Date Taking? Authorizing Provider  Doxylamine-Pyridoxine 10-10 MG TBEC Take 2 tablets by mouth at bedtime. 11/25/16   Domenick GongMortenson, Ashley, MD  fluconazole (DIFLUCAN) 150 MG tablet Take 1 tablet (150 mg total) by mouth daily. Take second dose 72 hours later if symptoms still persists. 01/28/18   Cathie HoopsYu, Lavena Loretto V, PA-C  trimethoprim-polymyxin b (POLYTRIM) ophthalmic solution Place 1 drop every 4 (four) hours into the right eye. 12/16/16   Hayden RasmussenMabe, David, NP  Norethin Ace-Eth Estrad-FE 1-20 MG-MCG(24) TABS Take 1 tablet by mouth daily.  06/13/10 04/17/11  Ta, Cat, MD    Family History Family History  Problem Relation Age of Onset  . Asthma Sister   . Cancer Neg Hx   . Diabetes Neg Hx   . Hypertension Neg Hx     Social History Social History   Tobacco Use  . Smoking status: Never Smoker  . Smokeless tobacco: Never Used  Substance Use Topics  . Alcohol use: No  . Drug use: No     Allergies   Flagyl [metronidazole]   Review of Systems Review of Systems  Reason unable to perform ROS: See HPI as above.     Physical Exam Triage Vital Signs ED Triage Vitals  Enc Vitals Group     BP 01/28/18 1103 115/76     Pulse Rate 01/28/18 1103 71     Resp 01/28/18 1103 16     Temp 01/28/18 1103 97.8 F (36.6 C)     Temp Source 01/28/18 1103 Oral     SpO2 01/28/18 1103 100 %     Weight --      Height --      Head Circumference --      Peak Flow --      Pain Score 01/28/18 1108  0     Pain Loc --      Pain Edu? --      Excl. in GC? --    No data found.  Updated Vital Signs BP 115/76 (BP Location: Right Arm)   Pulse 71   Temp 97.8 F (36.6 C) (Oral)   Resp 16   LMP 01/10/2018   SpO2 100%   Physical Exam Constitutional:      General: She is not in acute distress.    Appearance: She is well-developed.  HENT:     Head: Normocephalic and atraumatic.  Eyes:     Conjunctiva/sclera: Conjunctivae normal.     Pupils: Pupils are equal, round, and reactive to light.  Cardiovascular:     Rate and Rhythm: Normal rate and regular rhythm.     Heart sounds: Normal heart sounds. No murmur. No friction rub. No gallop.   Pulmonary:     Effort: Pulmonary effort is normal.     Breath sounds: Normal breath sounds. No wheezing or rales.  Abdominal:     General: Bowel sounds are normal.     Palpations: Abdomen is soft. There is no mass.     Tenderness: There is no abdominal tenderness. There is no guarding or rebound.  Skin:    General: Skin is warm and dry.  Neurological:     Mental Status: She is alert and  oriented to person, place, and time.  Psychiatric:        Behavior: Behavior normal.        Judgment: Judgment normal.      UC Treatments / Results  Labs (all labs ordered are listed, but only abnormal results are displayed) Labs Reviewed - No data to display  EKG None  Radiology No results found.  Procedures Procedures (including critical care time)  Medications Ordered in UC Medications - No data to display  Initial Impression / Assessment and Plan / UC Course  I have reviewed the triage vital signs and the nursing notes.  Pertinent labs & imaging results that were available during my care of the patient were reviewed by me and considered in my medical decision making (see chart for details).    Start diflucan as directed for possible yeast infection. Discussed avoid rubbing/itching area. Return precaution given.   Final Clinical Impressions(s) / UC Diagnoses   Final diagnoses:  Vaginal itching    ED Prescriptions    Medication Sig Dispense Auth. Provider   fluconazole (DIFLUCAN) 150 MG tablet Take 1 tablet (150 mg total) by mouth daily. Take second dose 72 hours later if symptoms still persists. 2 tablet Threasa Alpha, PA-C 01/28/18 1303

## 2018-03-25 ENCOUNTER — Ambulatory Visit (HOSPITAL_COMMUNITY)
Admission: EM | Admit: 2018-03-25 | Discharge: 2018-03-25 | Disposition: A | Discharge status: Home / Self Care | Payer: Self-pay

## 2018-03-25 NOTE — ED Notes (Signed)
Patient access staff said patient decided to leave after realizing during registration process that she no longer has insurance.

## 2018-04-28 ENCOUNTER — Encounter (HOSPITAL_COMMUNITY): Payer: Self-pay

## 2018-04-28 ENCOUNTER — Ambulatory Visit (HOSPITAL_COMMUNITY)
Admission: EM | Admit: 2018-04-28 | Discharge: 2018-04-28 | Disposition: A | Discharge status: Home / Self Care | Payer: Medicaid Other | Attending: Family Medicine | Admitting: Family Medicine

## 2018-04-28 ENCOUNTER — Other Ambulatory Visit: Payer: Self-pay

## 2018-04-28 DIAGNOSIS — N898 Other specified noninflammatory disorders of vagina: Secondary | ICD-10-CM | POA: Insufficient documentation

## 2018-04-28 DIAGNOSIS — Z113 Encounter for screening for infections with a predominantly sexual mode of transmission: Secondary | ICD-10-CM

## 2018-04-28 DIAGNOSIS — Z3202 Encounter for pregnancy test, result negative: Secondary | ICD-10-CM

## 2018-04-28 LAB — POCT PREGNANCY, URINE: Preg Test, Ur: NEGATIVE

## 2018-04-28 MED ORDER — FLUCONAZOLE 150 MG PO TABS: 150.0000 mg | ORAL_TABLET | Freq: Once | ORAL | 0 refills | Status: AC

## 2018-04-28 MED ORDER — METRONIDAZOLE 0.75 % EX GEL: 1.0000 "application " | Freq: Two times a day (BID) | CUTANEOUS | 0 refills | Status: AC

## 2018-04-28 NOTE — ED Triage Notes (Signed)
Pt presents with vaginal discharge and odor for over 2 weeks, pt has had no relief with OTC vaginal creams.

## 2018-04-28 NOTE — ED Provider Notes (Signed)
[facility]    CSN: [id number] Arrival date & time: 04/28/18  1542     History   Chief Complaint Chief Complaint  Patient presents with  . Vaginal Discharge    HPI Robin Craig is a 23 y.o. female no contribute past medical history presenting today for evaluation of vaginal discharge.  Patient states she has had vaginal discharge for the past 2 weeks.  She is also had associated odor.  Patient notes that she has a history of BV as well as yeast.  She denies any itching or irritation.  She notes that she is sexually active mainly with females and notes that often will get BV from using certain toys.  She is tried over-the-counter creams without relief.  She has had some mild nausea and lower abdominal discomfort today but this is no longer persistent at time of visit.  HPI  Past Medical History:  Diagnosis Date  . Chlamydia   . Chronic kidney disease    kidney stones  . GC (gonococcus infection)   . UTI (urinary tract infection)   . Yeast vaginitis     Patient Active Problem List   Diagnosis Date Noted  . Vaginal delivery 08/10/2016  . History of kidney stones--early pregnancy 07/05/2016    Past Surgical History:  Procedure Laterality Date  . NO PAST SURGERIES    . WISDOM TOOTH EXTRACTION  2010    OB History    Gravida  3   Para  2   Term  2   Preterm      AB  1   Living  2     SAB  1   TAB      Ectopic      Multiple  0   Live Births  2            Home Medications    Prior to Admission medications   Medication Sig Start Date End Date Taking? Authorizing Provider  Doxylamine-Pyridoxine 10-10 MG TBEC Take 2 tablets by mouth at bedtime. 11/25/16   Domenick Gong, MD  fluconazole (DIFLUCAN) 150 MG tablet Take 1 tablet (150 mg total) by mouth once for 1 dose. 04/28/18 04/28/18  ,  C, PA-C  metroNIDAZOLE (METROGEL) 0.75 % gel Apply 1 application topically 2 (two) times daily for 7 days. 04/28/18 05/05/18  ,   C, PA-C  trimethoprim-polymyxin b (POLYTRIM) ophthalmic solution Place 1 drop every 4 (four) hours into the right eye. 12/16/16   Hayden Rasmussen, NP  Norethin Ace-Eth Estrad-FE 1-20 MG-MCG(24) TABS Take 1 tablet by mouth daily. 06/13/10 04/17/11  Ta, Cat, MD    Family History Family History  Problem Relation Age of Onset  . Asthma Sister   . Cancer Neg Hx   . Diabetes Neg Hx   . Hypertension Neg Hx     Social History Social History   Tobacco Use  . Smoking status: Never Smoker  . Smokeless tobacco: Never Used  Substance Use Topics  . Alcohol use: No  . Drug use: No     Allergies   Flagyl [metronidazole]   Review of Systems Review of Systems  Constitutional: Negative for fever.  Respiratory: Negative for shortness of breath.   Cardiovascular: Negative for chest pain.  Gastrointestinal: Negative for abdominal pain, diarrhea, nausea and vomiting.  Genitourinary: Positive for vaginal discharge. Negative for dysuria, flank pain, genital sores, hematuria, menstrual problem, vaginal bleeding and vaginal pain.  Musculoskeletal: Negative for back pain.  Skin: Negative for rash.  Neurological: Negative for dizziness, light-headedness and headaches.     Physical Exam Triage Vital Signs ED Triage Vitals  Enc Vitals Group     BP 04/28/18 1621 (!) 112/56     Pulse Rate 04/28/18 1621 75     Resp 04/28/18 1621 20     Temp 04/28/18 1621 98.3 F (36.8 C)     Temp Source 04/28/18 1621 Oral     SpO2 04/28/18 1621 100 %     Weight --      Height --      Head Circumference --      Peak Flow --      Pain Score 04/28/18 1623 0     Pain Loc --      Pain Edu? --      Excl. in GC? --    No data found.  Updated Vital Signs BP (!) 112/56 (BP Location: Right Arm)   Pulse 75   Temp 98.3 F (36.8 C) (Oral)   Resp 20   LMP 04/08/2018   SpO2 100%   Visual Acuity Right Eye Distance:   Left Eye Distance:   Bilateral Distance:    Right Eye Near:   Left Eye Near:     Bilateral Near:     Physical Exam Vitals signs and nursing note reviewed.  Constitutional:      General: She is not in acute distress.    Appearance: She is well-developed.  HENT:     Head: Normocephalic and atraumatic.  Eyes:     Conjunctiva/sclera: Conjunctivae normal.  Neck:     Musculoskeletal: Neck supple.  Cardiovascular:     Rate and Rhythm: Normal rate and regular rhythm.     Heart sounds: No murmur.  Pulmonary:     Effort: Pulmonary effort is normal. No respiratory distress.     Breath sounds: Normal breath sounds.  Abdominal:     Palpations: Abdomen is soft.     Tenderness: There is no abdominal tenderness.     Comments: Nontender to light and deep palpation throughout abdomen  Genitourinary:    Comments: Deferred Skin:    General: Skin is warm and dry.  Neurological:     Mental Status: She is alert.      UC Treatments / Results  Labs (all labs ordered are listed, but only abnormal results are displayed) Labs Reviewed  POC URINE PREG, ED  CERVICOVAGINAL ANCILLARY ONLY    EKG None  Radiology No results found.  Procedures Procedures (including critical care time)  Medications Ordered in UC Medications - No data to display  Initial Impression / Assessment and Plan / UC Course  I have reviewed the triage vital signs and the nursing notes.  Pertinent labs & imaging results that were available during my care of the patient were reviewed by me and considered in my medical decision making (see chart for details).     Empirically treat for BV and yeast with MetroGel as well as Diflucan.  States that she has a lot of nausea and side effects with oral metronidazole.  Vaginal swab obtained, pregnancy negative.  Will call patient with results and alter treatment as needed.Discussed strict return precautions. Patient verbalized understanding and is agreeable with plan.  Final Clinical Impressions(s) / UC Diagnoses   Final diagnoses:  Vaginal discharge      Discharge Instructions     Begin using MetroGel 1-2 times a day for the next week.  This will treat for BV.  Take 1 tablet  of Diflucan today, repeat after finishing MetroGel to treat for yeast.  We are testing you for Gonorrhea, Chlamydia, Trichomonas, Yeast and Bacterial Vaginosis. We will call you if anything is positive and let you know if you require any further treatment. Please inform partners of any positive results.   Please return if symptoms not improving with treatment, development of fever, nausea, vomiting, abdominal pain.    ED Prescriptions    Medication Sig Dispense Auth. Provider   fluconazole (DIFLUCAN) 150 MG tablet Take 1 tablet (150 mg total) by mouth once for 1 dose. 2 tablet ,  C, PA-C   metroNIDAZOLE (METROGEL) 0.75 % gel Apply 1 application topically 2 (two) times daily for 7 days. 45 g , [place] C, PA-C     Controlled Substance Prescriptions [place] Controlled Substance Registry consulted? Not Applicable   Lew Dawes, [place] 04/28/18 1657

## 2018-04-28 NOTE — Discharge Instructions (Signed)
Begin using MetroGel 1-2 times a day for the next week.  This will treat for BV.  Take 1 tablet of Diflucan today, repeat after finishing MetroGel to treat for yeast.  We are testing you for Gonorrhea, Chlamydia, Trichomonas, Yeast and Bacterial Vaginosis. We will call you if anything is positive and let you know if you require any further treatment. Please inform partners of any positive results.   Please return if symptoms not improving with treatment, development of fever, nausea, vomiting, abdominal pain.

## 2018-04-29 LAB — CERVICOVAGINAL ANCILLARY ONLY
Bacterial vaginitis: POSITIVE — AB
Candida vaginitis: NEGATIVE
Chlamydia: NEGATIVE
Neisseria Gonorrhea: NEGATIVE
Trichomonas: NEGATIVE

## 2018-07-28 ENCOUNTER — Encounter (HOSPITAL_COMMUNITY): Payer: Self-pay

## 2018-07-28 ENCOUNTER — Other Ambulatory Visit: Payer: Self-pay

## 2018-07-28 ENCOUNTER — Ambulatory Visit (HOSPITAL_COMMUNITY)
Admission: EM | Admit: 2018-07-28 | Discharge: 2018-07-28 | Disposition: A | Discharge status: Home / Self Care | Payer: Medicaid Other | Attending: Internal Medicine | Admitting: Internal Medicine

## 2018-07-28 DIAGNOSIS — B9689 Other specified bacterial agents as the cause of diseases classified elsewhere: Secondary | ICD-10-CM

## 2018-07-28 DIAGNOSIS — N390 Urinary tract infection, site not specified: Secondary | ICD-10-CM | POA: Insufficient documentation

## 2018-07-28 LAB — POCT URINALYSIS DIP (DEVICE)
Bilirubin Urine: NEGATIVE
Glucose, UA: NEGATIVE mg/dL
Hgb urine dipstick: NEGATIVE
Ketones, ur: NEGATIVE mg/dL
Nitrite: NEGATIVE
Protein, ur: NEGATIVE mg/dL
Specific Gravity, Urine: 1.02 (ref 1.005–1.030)
Urobilinogen, UA: 1 mg/dL (ref 0.0–1.0)
pH: 8.5 — ABNORMAL HIGH (ref 5.0–8.0)

## 2018-07-28 MED ORDER — CEPHALEXIN 500 MG PO CAPS: 500.0000 mg | ORAL_CAPSULE | Freq: Two times a day (BID) | ORAL | 0 refills | Status: AC

## 2018-07-28 NOTE — ED Provider Notes (Signed)
[facility]    CSN: [id number] Arrival date & time: 07/28/18  1154     History   Chief Complaint Chief Complaint  Patient presents with  . Urinary Tract Infection    HPI Robin Craig is a 23 y.o. female with history of UTI yeast infections presenting for acute concern of UTI.  Patient endorsing urinary frequency, burning with urination last 4 days.  Patient denies vaginal/pelvic pain, vaginal discharge, hematuria.  Patient denies history of pyelonephritis, though does endorse remote history of renal stone.  Patient denies obstructive symptoms such as decreased stream, anuria, difficulty/straining to urinate.  LMP 2 weeks ago, finished "last week ".  Patient denies yeast infections status post antibiotic use.  Past Medical History:  Diagnosis Date  . Chlamydia   . Chronic kidney disease    kidney stones  . GC (gonococcus infection)   . UTI (urinary tract infection)   . Yeast vaginitis     Patient Active Problem List   Diagnosis Date Noted  . Vaginal delivery 08/10/2016  . History of kidney stones--early pregnancy 07/05/2016    Past Surgical History:  Procedure Laterality Date  . NO PAST SURGERIES    . WISDOM TOOTH EXTRACTION  2010    OB History    Gravida  3   Para  2   Term  2   Preterm      AB  1   Living  2     SAB  1   TAB      Ectopic      Multiple  0   Live Births  2            Home Medications    Prior to Admission medications   Medication Sig Start Date End Date Taking? Authorizing Provider  cephALEXin (KEFLEX) 500 MG capsule Take 1 capsule (500 mg total) by mouth 2 (two) times daily for 5 days. 07/28/18 08/02/18  Hall-Potvin, [place], PA-C  Norethin Ace-Eth Estrad-FE 1-20 MG-MCG(24) TABS Take 1 tablet by mouth daily. 06/13/10 04/17/11  Ta, Cat, MD    Family History Family History  Problem Relation Age of Onset  . Asthma Sister   . Cancer Neg Hx   . Diabetes Neg Hx   . Hypertension Neg Hx     Social History  Social History   Tobacco Use  . Smoking status: Never Smoker  . Smokeless tobacco: Never Used  Substance Use Topics  . Alcohol use: No  . Drug use: No     Allergies   Flagyl [metronidazole]   Review of Systems As per HPI   Physical Exam Triage Vital Signs ED Triage Vitals  Enc Vitals Group     BP 07/28/18 1222 112/82     Pulse Rate 07/28/18 1222 79     Resp 07/28/18 1222 16     Temp 07/28/18 1222 98.5 F (36.9 C)     Temp Source 07/28/18 1222 Oral     SpO2 07/28/18 1222 98 %     Weight 07/28/18 1224 136 lb (61.7 kg)     Height --      Head Circumference --      Peak Flow --      Pain Score 07/28/18 1224 8     Pain Loc --      Pain Edu? --      Excl. in [place]? --    No data found.  Updated Vital Signs BP 112/82 (BP Location: Right Arm)   Pulse 79  Temp 98.5 F (36.9 C) (Oral)   Resp 16   Wt 136 lb (61.7 kg)   LMP 07/13/2018   SpO2 98%   BMI 23.34 kg/m   Visual Acuity Right Eye Distance:   Left Eye Distance:   Bilateral Distance:    Right Eye Near:   Left Eye Near:    Bilateral Near:     Physical Exam Constitutional:      General: She is not in acute distress. HENT:     Head: Normocephalic and atraumatic.  Eyes:     General: No scleral icterus.    Pupils: Pupils are equal, round, and reactive to light.  Cardiovascular:     Rate and Rhythm: Normal rate.  Pulmonary:     Effort: Pulmonary effort is normal.  Abdominal:     General: Abdomen is flat. Bowel sounds are normal. There is no distension.     Palpations: Abdomen is soft.     Tenderness: There is no abdominal tenderness. There is no right CVA tenderness, left CVA tenderness or guarding.  Skin:    Coloration: Skin is not jaundiced or pale.  Neurological:     Mental Status: She is alert and oriented to person, place, and time.      UC Treatments / Results  Labs (all labs ordered are listed, but only abnormal results are displayed) Labs Reviewed  POCT URINALYSIS DIP (DEVICE) -  Abnormal; Notable for the following components:      Result Value   pH 8.5 (*)    Leukocytes,Ua SMALL (*)    All other components within normal limits  URINE CULTURE    EKG None  Radiology No results found.  Procedures Procedures (including critical care time)  Medications Ordered in UC Medications - No data to display  Initial Impression / Assessment and Plan / UC Course  I have reviewed the triage vital signs and the nursing notes.  Pertinent labs & imaging results that were available during my care of the patient were reviewed by me and considered in my medical decision making (see chart for details).     23 year old female with history of UTI and yeast infection presenting for symptoms concerning for UTI.  POCT urinalysis significant for urine alkalosis and leukocytosis, will send for culture and treat with Keflex for now.  Will call patient if antibiotic regimen needs to be changed.  Return precautions discussed, patient verbalized understanding. Final Clinical Impressions(s) / UC Diagnoses   Final diagnoses:  Lower urinary tract infectious disease     Discharge Instructions     Take antibiotics as prescribed. We will call you if your urine culture shows you need to change antibiotics.     ED Prescriptions    Medication Sig Dispense Auth. Provider   cephALEXin (KEFLEX) 500 MG capsule Take 1 capsule (500 mg total) by mouth 2 (two) times daily for 5 days. 10 capsule Hall-Potvin, [place], PA-C     Controlled Substance Prescriptions [place] Controlled Substance Registry consulted? Not Applicable   Shea EvansHall-Potvin, Brittany, [place]-C 07/28/18 1717

## 2018-07-28 NOTE — ED Triage Notes (Signed)
Pt states she has a UTI because she has pressure when she voids and she has lower back pain.

## 2018-07-28 NOTE — Discharge Instructions (Signed)
Take antibiotics as prescribed. We will call you if your urine culture shows you need to change antibiotics.

## 2018-07-29 ENCOUNTER — Telehealth (HOSPITAL_COMMUNITY): Payer: Self-pay | Admitting: Emergency Medicine

## 2018-07-29 MED ORDER — ONDANSETRON HCL 4 MG PO TABS: 4.0000 mg | ORAL_TABLET | Freq: Three times a day (TID) | ORAL | 0 refills | Status: DC | PRN

## 2018-07-30 LAB — URINE CULTURE
Culture: >=100000 — AB
Special Requests: NORMAL

## 2018-07-31 ENCOUNTER — Telehealth (HOSPITAL_COMMUNITY): Payer: Self-pay | Admitting: Emergency Medicine

## 2018-07-31 NOTE — Telephone Encounter (Signed)
Urine culture was positive for KLEBSIELLA PNEUMONIAE and was given keflex  at urgent care visit. Attempted to reach patient. No answer at this time.  

## 2018-12-04 ENCOUNTER — Other Ambulatory Visit: Payer: Self-pay

## 2018-12-04 DIAGNOSIS — Z20822 Contact with and (suspected) exposure to covid-19: Secondary | ICD-10-CM

## 2018-12-05 LAB — NOVEL CORONAVIRUS, NAA: SARS-CoV-2, NAA: NOT DETECTED

## 2019-02-12 HISTORY — PX: CHOLECYSTECTOMY: SHX55

## 2019-04-05 ENCOUNTER — Ambulatory Visit (HOSPITAL_COMMUNITY)
Admission: EM | Admit: 2019-04-05 | Discharge: 2019-04-05 | Disposition: A | Discharge status: Home / Self Care | Payer: Medicaid Other

## 2019-04-05 NOTE — ED Notes (Signed)
Pt LWBS after waiting 3 minutes, made an appt for tomorrow and said she will come back at that time.

## 2019-04-06 ENCOUNTER — Encounter: Payer: Self-pay | Admitting: Emergency Medicine

## 2019-04-06 ENCOUNTER — Other Ambulatory Visit: Payer: Self-pay

## 2019-04-06 ENCOUNTER — Ambulatory Visit
Admission: EM | Admit: 2019-04-06 | Discharge: 2019-04-06 | Disposition: A | Discharge status: Home / Self Care | Payer: Medicaid Other | Attending: Emergency Medicine | Admitting: Emergency Medicine

## 2019-04-06 DIAGNOSIS — B9689 Other specified bacterial agents as the cause of diseases classified elsewhere: Secondary | ICD-10-CM

## 2019-04-06 DIAGNOSIS — Z7251 High risk heterosexual behavior: Secondary | ICD-10-CM

## 2019-04-06 DIAGNOSIS — N760 Acute vaginitis: Secondary | ICD-10-CM | POA: Insufficient documentation

## 2019-04-06 MED ORDER — FLUCONAZOLE 200 MG PO TABS: 200.0000 mg | ORAL_TABLET | Freq: Once | ORAL | 0 refills | Status: AC

## 2019-04-06 MED ORDER — METRONIDAZOLE 500 MG PO TABS: 500.0000 mg | ORAL_TABLET | Freq: Two times a day (BID) | ORAL | 0 refills | Status: DC

## 2019-04-06 NOTE — ED Triage Notes (Signed)
Pt presents to [facility] for assessment after using a dildo on valentine's day with her female partner and has been having thick, white discharge since.  Hx of BV, states it feels the same.  States her clitoris is swollen.

## 2019-04-06 NOTE — ED Provider Notes (Signed)
[facility]    CSN: [id number] Arrival date & time: 04/06/19  1151      History   Chief Complaint Chief Complaint  Patient presents with  . Vaginal Discharge    HPI Robin Craig is a 24 y.o. female presenting for vaginal discharge since Valentine's Day.  States she said thick, white discharge without pruritus or significant irritation since using sex toys with her female partner.  States she had some clitoral swelling, though is nontender.  Reports history of BV, feels this is the same.  Patient also endorsing history of chlamydia, gonorrhea.  Denying back, abdominal, pelvic pain, new anogenital lesions, multiple sexual partners.  Urinary symptoms such as frequency, urgency, or hematuria.  LMP 04/06/2019, normal for her.   Past Medical History:  Diagnosis Date  . Chlamydia   . Chronic kidney disease    kidney stones  . GC (gonococcus infection)   . UTI (urinary tract infection)   . Yeast vaginitis     Patient Active Problem List   Diagnosis Date Noted  . Vaginal delivery 08/10/2016  . History of kidney stones--early pregnancy 07/05/2016    Past Surgical History:  Procedure Laterality Date  . NO PAST SURGERIES    . WISDOM TOOTH EXTRACTION  2010    OB History    Gravida  3   Para  2   Term  2   Preterm      AB  1   Living  2     SAB  1   TAB      Ectopic      Multiple  0   Live Births  2            Home Medications    Prior to Admission medications   Medication Sig Start Date End Date Taking? Authorizing Provider  fluconazole (DIFLUCAN) 200 MG tablet Take 1 tablet (200 mg total) by mouth once for 1 dose. May repeat in 72 hours if needed 04/06/19 04/06/19  Hall-Potvin, [place], PA-C  metroNIDAZOLE (FLAGYL) 500 MG tablet Take 1 tablet (500 mg total) by mouth 2 (two) times daily. 04/06/19   Hall-Potvin, [place], PA-C  ondansetron (ZOFRAN) 4 MG tablet Take 1 tablet (4 mg total) by mouth every 8 (eight) hours as needed for nausea or  vomiting. 07/29/18   Bast, Traci A, NP  Norethin Ace-Eth Estrad-FE 1-20 MG-MCG(24) TABS Take 1 tablet by mouth daily. 06/13/10 04/17/11  Ta, Cat, MD    Family History Family History  Problem Relation Age of Onset  . Asthma Sister   . Cancer Neg Hx   . Diabetes Neg Hx   . Hypertension Neg Hx     Social History Social History   Tobacco Use  . Smoking status: Never Smoker  . Smokeless tobacco: Never Used  Substance Use Topics  . Alcohol use: No  . Drug use: No     Allergies   Flagyl [metronidazole]   Review of Systems As per HPI   Physical Exam Triage Vital Signs ED Triage Vitals  Enc Vitals Group     BP      Pulse      Resp      Temp      Temp src      SpO2      Weight      Height      Head Circumference      Peak Flow      Pain Score      Pain  Loc      Pain Edu?      Excl. in [place]?    No data found.  Updated Vital Signs BP 128/74 (BP Location: Left Arm)   Pulse 69   Temp 97.8 F (36.6 C) (Temporal)   Resp 16   LMP 04/06/2019   SpO2 98%   Visual Acuity Right Eye Distance:   Left Eye Distance:   Bilateral Distance:    Right Eye Near:   Left Eye Near:    Bilateral Near:     Physical Exam Constitutional:      General: She is not in acute distress. HENT:     Head: Normocephalic and atraumatic.  Eyes:     General: No scleral icterus.    Pupils: Pupils are equal, round, and reactive to light.  Cardiovascular:     Rate and Rhythm: Normal rate.  Pulmonary:     Effort: Pulmonary effort is normal.  Abdominal:     General: Bowel sounds are normal.     Palpations: Abdomen is soft.     Tenderness: There is no abdominal tenderness. There is no right CVA tenderness, left CVA tenderness or guarding.  Genitourinary:    Comments: Patient declined, self-swab performed Skin:    Coloration: Skin is not jaundiced or pale.  Neurological:     Mental Status: She is alert and oriented to person, place, and time.      UC Treatments / Results  Labs (all  labs ordered are listed, but only abnormal results are displayed) Labs Reviewed  CERVICOVAGINAL ANCILLARY ONLY    EKG   Radiology No results found.  Procedures Procedures (including critical care time)  Medications Ordered in UC Medications - No data to display  Initial Impression / Assessment and Plan / UC Course  I have reviewed the triage vital signs and the nursing notes.  Pertinent labs & imaging results that were available during my care of the patient were reviewed by me and considered in my medical decision making (see chart for details).     Patient febrile, nontoxic.  History most consistent with BV: We will treat accordingly.  Has tolerated Flagyl well in the past.  Does endorse history of vaginal yeast infection status post antibiotic use: Diflucan sent.  Given history of STD, cytology pending: We will treat if indicated.  Return precautions discussed, patient verbalized understanding and is agreeable to plan. Final Clinical Impressions(s) / UC Diagnoses   Final diagnoses:  Acute vaginitis  Unprotected sex     Discharge Instructions     Testing for chlamydia, gonorrhea, trichomonas is pending: please look for these results on the MyChart app/website.  We will notify you if you are positive and outline treatment at that time.  Important to avoid all forms of sexual intercourse (oral, vaginal, anal) with any/all partners for the next 7 days to avoid spreading/reinfecting. Any/all sexual partners should be notified of testing/treatment today.  Return for persistent/worsening symptoms or if you develop fever, abdominal or pelvic pain, blood in your urine, or are re-exposed to an STI.    ED Prescriptions    Medication Sig Dispense Auth. Provider   metroNIDAZOLE (FLAGYL) 500 MG tablet Take 1 tablet (500 mg total) by mouth 2 (two) times daily. 14 tablet Hall-Potvin, [place], PA-C   fluconazole (DIFLUCAN) 200 MG tablet Take 1 tablet (200 mg total) by mouth once for  1 dose. May repeat in 72 hours if needed 2 tablet Hall-Potvin, [place], PA-C     PDMP not reviewed  this encounter.   Hall-Potvin, [place], [place] 04/06/19 1300

## 2019-04-06 NOTE — Discharge Instructions (Addendum)

## 2019-04-07 ENCOUNTER — Telehealth: Payer: Self-pay | Admitting: Emergency Medicine

## 2019-04-08 LAB — CERVICOVAGINAL ANCILLARY ONLY
Chlamydia: NEGATIVE
Neisseria Gonorrhea: NEGATIVE
Trichomonas: NEGATIVE

## 2019-05-29 ENCOUNTER — Ambulatory Visit: Payer: Medicaid Other | Attending: Internal Medicine

## 2019-05-29 DIAGNOSIS — Z23 Encounter for immunization: Secondary | ICD-10-CM

## 2019-05-29 NOTE — Progress Notes (Signed)
   Covid-19 Vaccination Clinic  Name:  Alante Weimann    MRN: [id number] DOB: 06/27/1995  05/29/2019  Ms. Olgin was observed post Covid-19 immunization for 15 minutes without incident. She was provided with Vaccine Information Sheet and instruction to access the V-Safe system.   Ms. Whiteaker was instructed to call 911 with any severe reactions post vaccine: Marland Kitchen Difficulty breathing  . Swelling of face and throat  . A fast heartbeat  . A bad rash all over body  . Dizziness and weakness   Immunizations Administered    Name Date Dose VIS Date Route   Pfizer COVID-19 Vaccine 05/29/2019  8:54 AM 0.3 mL 01/22/2019 Intramuscular   Manufacturer: [employer], [employer]   Lot: [id number]   NDC: [address]-8

## 2019-06-21 ENCOUNTER — Ambulatory Visit: Payer: Medicaid Other

## 2019-06-23 ENCOUNTER — Ambulatory Visit: Payer: Medicaid Other | Attending: Internal Medicine

## 2019-06-23 DIAGNOSIS — Z23 Encounter for immunization: Secondary | ICD-10-CM

## 2019-06-23 NOTE — Progress Notes (Signed)
   Covid-19 Vaccination Clinic  Name:  Robin Craig    MRN: [id number] DOB: 1995/03/24  06/23/2019  [name] was observed post Covid-19 immunization for 15 minutes without incident. She was provided with Vaccine Information Sheet and instruction to access the V-Safe system.   [name] was instructed to call 911 with any severe reactions post vaccine: Marland Kitchen Difficulty breathing  . Swelling of face and throat  . A fast heartbeat  . A bad rash all over body  . Dizziness and weakness   Immunizations Administered    Name Date Dose VIS Date Route   Pfizer COVID-19 Vaccine 06/23/2019  2:02 PM 0.3 mL 04/07/2018 Intramuscular   Manufacturer: [employer], [employer]   Lot: [id number]   NDC: [address]-1

## 2019-09-28 ENCOUNTER — Emergency Department (HOSPITAL_COMMUNITY)
Admission: EM | Admit: 2019-09-28 | Discharge: 2019-09-28 | Disposition: A | Discharge status: Home / Self Care | Payer: BC Managed Care – PPO | Attending: Emergency Medicine | Admitting: Emergency Medicine

## 2019-09-28 ENCOUNTER — Other Ambulatory Visit: Payer: Self-pay

## 2019-09-28 ENCOUNTER — Emergency Department (HOSPITAL_COMMUNITY): Payer: BC Managed Care – PPO

## 2019-09-28 DIAGNOSIS — R197 Diarrhea, unspecified: Secondary | ICD-10-CM | POA: Insufficient documentation

## 2019-09-28 DIAGNOSIS — N201 Calculus of ureter: Secondary | ICD-10-CM | POA: Insufficient documentation

## 2019-09-28 DIAGNOSIS — R109 Unspecified abdominal pain: Secondary | ICD-10-CM | POA: Diagnosis present

## 2019-09-28 DIAGNOSIS — Z5331 Laparoscopic surgical procedure converted to open procedure: Secondary | ICD-10-CM | POA: Diagnosis not present

## 2019-09-28 DIAGNOSIS — Z79899 Other long term (current) drug therapy: Secondary | ICD-10-CM | POA: Insufficient documentation

## 2019-09-28 DIAGNOSIS — N390 Urinary tract infection, site not specified: Secondary | ICD-10-CM | POA: Diagnosis not present

## 2019-09-28 DIAGNOSIS — N189 Chronic kidney disease, unspecified: Secondary | ICD-10-CM | POA: Diagnosis not present

## 2019-09-28 DIAGNOSIS — G8918 Other acute postprocedural pain: Secondary | ICD-10-CM | POA: Diagnosis not present

## 2019-09-28 LAB — COMPREHENSIVE METABOLIC PANEL
ALT: 18 U/L (ref 0–44)
AST: 18 U/L (ref 15–41)
Albumin: 4.5 g/dL (ref 3.5–5.0)
Alkaline Phosphatase: 41 U/L (ref 38–126)
Anion gap: 9 (ref 5–15)
BUN: 16 mg/dL (ref 6–20)
CO2: 27 mmol/L (ref 22–32)
Calcium: 9.6 mg/dL (ref 8.9–10.3)
Chloride: 103 mmol/L (ref 98–111)
Creatinine, Ser: 0.82 mg/dL (ref 0.44–1.00)
GFR calc Af Amer: >60 mL/min (ref >60)
GFR calc non Af Amer: >60 mL/min (ref >60)
Glucose, Bld: 96 mg/dL (ref 70–99)
Potassium: 4.1 mmol/L (ref 3.5–5.1)
Sodium: 139 mmol/L (ref 135–145)
Total Bilirubin: 0.6 mg/dL (ref 0.3–1.2)
Total Protein: 7.7 g/dL (ref 6.5–8.1)

## 2019-09-28 LAB — URINALYSIS, ROUTINE W REFLEX MICROSCOPIC
Bilirubin Urine: NEGATIVE
Glucose, UA: NEGATIVE mg/dL
Hgb urine dipstick: NEGATIVE
Ketones, ur: NEGATIVE mg/dL
Leukocytes,Ua: NEGATIVE
Nitrite: NEGATIVE
Protein, ur: NEGATIVE mg/dL
Specific Gravity, Urine: 1.024 (ref 1.005–1.030)
pH: 9 — ABNORMAL HIGH (ref 5.0–8.0)

## 2019-09-28 LAB — CBC
HCT: 42.1 % (ref 36.0–46.0)
Hemoglobin: 13.8 g/dL (ref 12.0–15.0)
MCH: 30.3 pg (ref 26.0–34.0)
MCHC: 32.8 g/dL (ref 30.0–36.0)
MCV: 92.3 fL (ref 80.0–100.0)
Platelets: 267 10*3/uL (ref 150–400)
RBC: 4.56 MIL/uL (ref 3.87–5.11)
RDW: 12.3 % (ref 11.5–15.5)
WBC: 9.2 10*3/uL (ref 4.0–10.5)
nRBC: 0 % (ref 0.0–0.2)

## 2019-09-28 LAB — I-STAT BETA HCG BLOOD, ED (MC, WL, AP ONLY): I-stat hCG, quantitative: <5 m[IU]/mL (ref <5)

## 2019-09-28 LAB — LIPASE, BLOOD: Lipase: 32 U/L (ref 11–51)

## 2019-09-28 MED ORDER — ONDANSETRON HCL 4 MG/2ML IJ SOLN
4.0000 mg | Freq: Once | INTRAMUSCULAR | Status: AC
  Administered 2019-09-28: 4 mg via INTRAVENOUS
  Filled 2019-09-28: qty 2

## 2019-09-28 MED ORDER — TAMSULOSIN HCL 0.4 MG PO CAPS
0.4000 mg | ORAL_CAPSULE | Freq: Every day | ORAL | 0 refills | Status: AC
Start: 2019-09-28 — End: 2019-10-28

## 2019-09-28 MED ORDER — MORPHINE SULFATE (PF) 4 MG/ML IV SOLN
4.0000 mg | Freq: Once | INTRAVENOUS | Status: AC
  Administered 2019-09-28: 4 mg via INTRAVENOUS
  Filled 2019-09-28: qty 1

## 2019-09-28 MED ORDER — SODIUM CHLORIDE (PF) 0.9 % IJ SOLN
INTRAMUSCULAR | Status: AC
  Filled 2019-09-28: qty 50

## 2019-09-28 MED ORDER — ONDANSETRON HCL 4 MG PO TABS
4.0000 mg | ORAL_TABLET | Freq: Four times a day (QID) | ORAL | 0 refills | Status: DC
Start: 2019-09-28 — End: 2021-04-24

## 2019-09-28 MED ORDER — OXYCODONE-ACETAMINOPHEN 5-325 MG PO TABS: 1.0000 | ORAL_TABLET | Freq: Three times a day (TID) | ORAL | 0 refills | Status: DC | PRN

## 2019-09-28 MED ORDER — OXYCODONE-ACETAMINOPHEN 5-325 MG PO TABS
1.0000 | ORAL_TABLET | Freq: Once | ORAL | Status: AC
  Administered 2019-09-28: 1 via ORAL
  Filled 2019-09-28: qty 1

## 2019-09-28 MED ORDER — IOHEXOL 300 MG/ML  SOLN
100.0000 mL | Freq: Once | INTRAMUSCULAR | Status: AC | PRN
  Administered 2019-09-28: 100 mL via INTRAVENOUS

## 2019-09-28 MED ORDER — MORPHINE SULFATE (PF) 4 MG/ML IV SOLN: 4.0000 mg | Freq: Once | INTRAVENOUS | Status: DC

## 2019-09-28 MED ORDER — METOCLOPRAMIDE HCL 5 MG/ML IJ SOLN
5.0000 mg | Freq: Once | INTRAMUSCULAR | Status: AC
  Administered 2019-09-28: 5 mg via INTRAVENOUS
  Filled 2019-09-28: qty 2

## 2019-09-28 MED ORDER — KETOROLAC TROMETHAMINE 30 MG/ML IJ SOLN
30.0000 mg | Freq: Once | INTRAMUSCULAR | Status: AC
  Administered 2019-09-28: 30 mg via INTRAVENOUS
  Filled 2019-09-28: qty 1

## 2019-09-28 MED ORDER — KETOROLAC TROMETHAMINE 30 MG/ML IJ SOLN: 30.0000 mg | Freq: Once | INTRAMUSCULAR | Status: DC

## 2019-09-28 MED ORDER — SODIUM CHLORIDE 0.9 % IV BOLUS
1000.0000 mL | Freq: Once | INTRAVENOUS | Status: AC
  Administered 2019-09-28: 1000 mL via INTRAVENOUS

## 2019-09-28 NOTE — Discharge Instructions (Addendum)
Today you were diagnosed with a kidney stone on your CT scan.  You will be given a prescription for Flomax, pain medication, and nausea medication.  You should not drive, work, or operate machinery while taking the pain medication as it can make you very drowsy.  You will need to follow-up with urology for reevaluation and for further treatment of your kidney stone.  You will need to return to the emergency department for any fevers, persistent pain, persistent vomiting, inability to urinate, or any new or worsening symptoms.  

## 2019-09-28 NOTE — ED Triage Notes (Signed)
Pt reports had gallbladder surgery last MOnday. Reports that today while getting ready for work, started having right sided pains again.

## 2019-09-28 NOTE — ED Notes (Signed)
Patient is moaning in pain, rolling on the floor and shaking the bed, she states that he back is on fire and she is in 10/10 pain.

## 2019-09-28 NOTE — ED Provider Notes (Signed)
[facility]-EMERGENCY DEPT Provider Note   CSN: [id number] Arrival date & time: 09/28/19  1148     History Chief Complaint  Patient presents with  . Post-op Problem  . Abdominal Pain    Robin Craig is a 24 y.o. female.  HPI   24 year old female with a history of chlamydia, gonorrhea, UTI, yeast vaginitis, kidney stones, who presents to the emergency department today for evaluation of abdominal pain.  Patient had laparoscopic cholecystectomy 09/19/2019 at [facility].  States that following the surgery she has not really been having any pain up until today when she was getting ready to go to work.  Pain started suddenly and has been constant since onset.  Pain is severe in nature.  Pain located to the right side of the abdomen and right flank.  Pain associated with nausea/vomiting.  She has had no fevers.  Denies any constipation, but has had some diarrhea.  Denies any urinary symptoms at this time.  Past Medical History:  Diagnosis Date  . Chlamydia   . Chronic kidney disease    kidney stones  . GC (gonococcus infection)   . UTI (urinary tract infection)   . Yeast vaginitis     Patient Active Problem List   Diagnosis Date Noted  . Vaginal delivery 08/10/2016  . History of kidney stones--early pregnancy 07/05/2016    Past Surgical History:  Procedure Laterality Date  . NO PAST SURGERIES    . WISDOM TOOTH EXTRACTION  2010     OB History    Gravida  3   Para  2   Term  2   Preterm      AB  1   Living  2     SAB  1   TAB      Ectopic      Multiple  0   Live Births  2           Family History  Problem Relation Age of Onset  . Asthma Sister   . Cancer Neg Hx   . Diabetes Neg Hx   . Hypertension Neg Hx     Social History   Tobacco Use  . Smoking status: Never Smoker  . Smokeless tobacco: Never Used  Substance Use Topics  . Alcohol use: No  . Drug use: No    Home Medications Prior to Admission medications     Medication Sig Start Date End Date Taking? Authorizing Provider  metroNIDAZOLE (FLAGYL) 500 MG tablet Take 1 tablet (500 mg total) by mouth 2 (two) times daily. Patient not taking: Reported on 09/28/2019 04/06/19   Hall-Potvin, [place], PA-C  ondansetron (ZOFRAN) 4 MG tablet Take 1 tablet (4 mg total) by mouth every 6 (six) hours. 09/28/19   Janiya Millirons S, PA-C  oxyCODONE-acetaminophen (PERCOCET/ROXICET) 5-325 MG tablet Take 1 tablet by mouth every 8 (eight) hours as needed for severe pain. 09/28/19   Genesi Stefanko S, PA-C  tamsulosin (FLOMAX) 0.4 MG CAPS capsule Take 1 capsule (0.4 mg total) by mouth daily. 09/28/19 10/28/19  Kyilee Gregg S, PA-C  Norethin Ace-Eth Estrad-FE 1-20 MG-MCG(24) TABS Take 1 tablet by mouth daily. 06/13/10 04/17/11  Ta, Cat, MD    Allergies    Flagyl [metronidazole]  Review of Systems   Review of Systems  Constitutional: Negative for fever.  HENT: Negative for ear pain and sore throat.   Eyes: Negative for visual disturbance.  Respiratory: Negative for cough and shortness of breath.   Cardiovascular: Negative for chest pain.  Gastrointestinal: Positive for abdominal pain, diarrhea, nausea and vomiting. Negative for constipation.  Genitourinary: Positive for flank pain. Negative for dysuria and hematuria.  Musculoskeletal: Negative for back pain.  Skin: Negative for rash.  Neurological: Negative for headaches.  All other systems reviewed and are negative.   Physical Exam Updated Vital Signs BP 108/74 (BP Location: Left Arm)   Pulse 79   Temp 98.1 F (36.7 C)   Resp 18   Ht 5\' 4"  (1.626 m)   Wt 56.7 kg   SpO2 100%   BMI 21.46 kg/m   Physical Exam Vitals and nursing note reviewed.  Constitutional:      General: She is not in acute distress.    Appearance: She is well-developed.  HENT:     Head: Normocephalic and atraumatic.  Eyes:     Conjunctiva/sclera: Conjunctivae normal.  Cardiovascular:     Rate and Rhythm: Normal rate and regular  rhythm.     Heart sounds: Normal heart sounds. No murmur heard.   Pulmonary:     Effort: Pulmonary effort is normal. No respiratory distress.     Breath sounds: Normal breath sounds. No wheezing, rhonchi or rales.  Abdominal:     Palpations: Abdomen is soft.     Tenderness: There is abdominal tenderness in the right upper quadrant and right lower quadrant. There is right CVA tenderness and guarding. There is no left CVA tenderness or rebound.  Musculoskeletal:     Cervical back: Neck supple.  Skin:    General: Skin is warm and dry.  Neurological:     Mental Status: She is alert.     ED Results / Procedures / Treatments   Labs (all labs ordered are listed, but only abnormal results are displayed) Labs Reviewed  URINALYSIS, ROUTINE W REFLEX MICROSCOPIC - Abnormal; Notable for the following components:      Result Value   APPearance CLOUDY (*)    pH 9.0 (*)    All other components within normal limits  LIPASE, BLOOD  COMPREHENSIVE METABOLIC PANEL  CBC  I-STAT BETA HCG BLOOD, ED ([facility], [facility], [facility] ONLY)    EKG None  Radiology CT ABDOMEN PELVIS W CONTRAST  Result Date: 09/28/2019 CLINICAL DATA:  24 year old female with history of abdominal pain and fever. Postoperative nausea and vomiting. History of cholecystectomy less Monday. EXAM: CT ABDOMEN AND PELVIS WITH CONTRAST TECHNIQUE: Multidetector CT imaging of the abdomen and pelvis was performed using the standard protocol following bolus administration of intravenous contrast. CONTRAST:  100mL OMNIPAQUE IOHEXOL 300 MG/ML  SOLN COMPARISON:  No priors. FINDINGS: Lower chest: Small hiatal hernia. Hepatobiliary: No suspicious cystic or solid hepatic lesions. No intra or extrahepatic biliary ductal dilatation. Small amount of fluid and soft tissue stranding in the gallbladder fossa related to recent cholecystectomy. Pancreas: No pancreatic mass. No pancreatic ductal dilatation. No pancreatic or peripancreatic fluid collections or inflammatory  changes. Spleen: Unremarkable. Adrenals/Urinary Tract: Moderate right-sided hydroureteronephrosis and delayed right nephrogram. Left kidney is normal in appearance. No left hydroureteronephrosis. At the right ureterovesicular junction (axial image 69 of series 2) there is a 5 mm obstructive calculus. Urinary bladder is nearly decompressed, but otherwise unremarkable in appearance. Bilateral adrenal glands are normal in appearance. Stomach/Bowel: Normal appearance of the stomach. No pathologic dilatation of small bowel or colon. Normal appendix. Vascular/Lymphatic: No significant atherosclerotic disease, aneurysm or dissection noted in the abdominal or pelvic vasculature. No lymphadenopathy noted in the abdomen or pelvis. Reproductive: Uterus and ovaries are unremarkable in appearance. Other: No significant volume  of ascites.  No pneumoperitoneum. Musculoskeletal: There are no aggressive appearing lytic or blastic lesions noted in the visualized portions of the skeleton. IMPRESSION: 1. 5 mm obstructive calculus at the right ureterovesicular junction with moderate proximal right hydroureteronephrosis. 2. Trace amount of fluid and soft tissue stranding in the gallbladder fossa related to recent cholecystectomy. This is an expected postoperative appearance. 3. Small hiatal hernia. Electronically Signed   By: Trudie Reed M.D.   On: 09/28/2019 15:20    Procedures Procedures (including critical care time)  Medications Ordered in ED Medications  sodium chloride (PF) 0.9 % injection (has no administration in time range)  sodium chloride 0.9 % bolus 1,000 mL (0 mLs Intravenous Stopped 09/28/19 1733)  ondansetron (ZOFRAN) injection 4 mg (4 mg Intravenous Given 09/28/19 1438)  morphine 4 MG/ML injection 4 mg (4 mg Intravenous Given 09/28/19 1437)  iohexol (OMNIPAQUE) 300 MG/ML solution 100 mL (100 mLs Intravenous Contrast Given 09/28/19 1455)  ketorolac (TORADOL) 30 MG/ML injection 30 mg (30 mg Intravenous Given  09/28/19 1527)  metoCLOPramide (REGLAN) injection 5 mg (5 mg Intravenous Given 09/28/19 1636)  morphine 4 MG/ML injection 4 mg (4 mg Intravenous Given 09/28/19 1638)  oxyCODONE-acetaminophen (PERCOCET/ROXICET) 5-325 MG per tablet 1 tablet (1 tablet Oral Given 09/28/19 1723)    ED Course  I have reviewed the triage vital signs and the nursing notes.  Pertinent labs & imaging results that were available during my care of the patient were reviewed by me and considered in my medical decision making (see chart for details).    MDM Rules/Calculators/A&P                          24 year old female presenting for evaluation of right-sided abdominal pain/right flank pain that started this morning.  She had a recent cholecystectomy but had not been having pain up until today when she tried to leave for work.  Reviewed/interpreted lab CBC is without leukocytosis or anemia CMP with normal LFTs and kidney function.  Normal electrolytes Lipase negative Beta-hCG negative UA negative for any evidence of infection  CT abdomen/pelvis does not show any postop changes in the gallbladder but does show a 5 mm ureteral stone on the right side.  This is likely the explanation for the patient's pain.  She was given IV fluids, analgesics, antiemetics.  After multiple reassessment patient states that pain is improved.  She has been able to tolerate p.o. and has had no further episodes of vomiting since receiving antiemetics.  We discussed results and plan to follow-up with urology.  She will be discharged with analgesics, Flomax and Zofran.  Advised on specific return precautions.  She voices understanding of the plan and reasons to return.  All questions answered.  Patient stable for discharge.   Final Clinical Impression(s) / ED Diagnoses Final diagnoses:  Ureteral stone    Rx / DC Orders ED Discharge Orders         Ordered    oxyCODONE-acetaminophen (PERCOCET/ROXICET) 5-325 MG tablet  Every 8 hours PRN      Discontinue  Reprint     09/28/19 1807    tamsulosin (FLOMAX) 0.4 MG CAPS capsule  Daily     Discontinue  Reprint     09/28/19 1807    ondansetron (ZOFRAN) 4 MG tablet  Every 6 hours     Discontinue  Reprint     09/28/19 1807           Rollie Hynek S, PA-C  09/28/19 1808    Vanetta Mulders, MD 10/04/19 [phone]

## 2019-10-19 ENCOUNTER — Other Ambulatory Visit: Payer: Self-pay | Admitting: Urology

## 2019-10-29 NOTE — Addendum Note (Signed)
Addended by: Karoline Caldwell B on: 10/29/2019 12:28 PM   Modules accepted: Orders

## 2019-11-01 ENCOUNTER — Other Ambulatory Visit (HOSPITAL_COMMUNITY): Payer: BC Managed Care – PPO | Attending: Urology

## 2019-11-02 ENCOUNTER — Other Ambulatory Visit (HOSPITAL_COMMUNITY)
Admission: RE | Admit: 2019-11-02 | Discharge: 2019-11-02 | Disposition: A | Discharge status: Home / Self Care | Payer: BC Managed Care – PPO | Source: Ambulatory Visit | Attending: Urology | Admitting: Urology

## 2019-11-02 DIAGNOSIS — Z01812 Encounter for preprocedural laboratory examination: Secondary | ICD-10-CM | POA: Diagnosis not present

## 2019-11-02 DIAGNOSIS — Z20822 Contact with and (suspected) exposure to covid-19: Secondary | ICD-10-CM | POA: Insufficient documentation

## 2019-11-02 LAB — SARS CORONAVIRUS 2 (TAT 6-24 HRS): SARS Coronavirus 2: NEGATIVE

## 2019-11-02 NOTE — Progress Notes (Signed)
Patient to arrive at 1430 on 11/04/2019. History and medications reviewed. Pre-procedure instructions given. NPO after MN except for clear liquids until 1230 day of procedure. Patient has not gotten her covid test. Instructed she needed to do this today. Driver secured.

## 2019-11-03 NOTE — H&P (Signed)
CC: Right ureteral calculus  HPI:  10/15/2019  24 year old female presented to the emergency department on 09/28/2019 with right-sided flank pain. She had a 5 mm obstructing right ureteral vesicular junction calculus with moderate right hydroureteronephrosis. She continues to have some right-sided flank pain. It is moderate in severity. Currently controlled. No nausea, vomiting, fever. She does have some dysuria. She has had 1 other stone in the past when she was pregnant. This was in 2017 and she passed it spontaneously.     ALLERGIES: None   MEDICATIONS: None   GU PSH: None   NON-GU PSH: Cholecystectomy (laparoscopic) - 09/20/2019     GU PMH: Renal calculus    NON-GU PMH: None   FAMILY HISTORY: 2 sons - Son Kidney Stones - Grandmother   SOCIAL HISTORY: Marital Status: Married Ethnicity: Not Hispanic Or Latino; Race: Black or African American Current Smoking Status: Patient has never smoked.   Tobacco Use Assessment Completed: Used Tobacco in last 30 days? Does drink.  Drinks 1 caffeinated drink per day.    REVIEW OF SYSTEMS:    GU Review Female:   Patient reports frequent urination, hard to postpone urination, burning /pain with urination, get up at night to urinate, and stream starts and stops. Patient denies leakage of urine, trouble starting your stream, have to strain to urinate, and being pregnant.  Gastrointestinal (Upper):   Patient denies nausea, vomiting, and indigestion/ heartburn.  Gastrointestinal (Lower):   Patient reports diarrhea. Patient denies constipation.  Constitutional:   Patient denies fever, night sweats, weight loss, and fatigue.  Skin:   Patient denies skin rash/ lesion and itching.  Eyes:   Patient denies blurred vision and double vision.  Ears/ Nose/ Throat:   Patient denies sinus problems and sore throat.  Hematologic/Lymphatic:   Patient denies swollen glands and easy bruising.  Cardiovascular:   Patient denies leg swelling and chest pains.   Respiratory:   Patient denies cough and shortness of breath.  Endocrine:   Patient denies excessive thirst.  Musculoskeletal:   Patient reports back pain. Patient denies joint pain.  Neurological:   Patient denies headaches and dizziness.  Psychologic:   Patient denies depression and anxiety.   Notes: UTI    VITAL SIGNS:      10/15/2019 01:55 PM  Weight 125 lb / 56.7 kg  Height 64 in / 162.56 cm  BP 123/76 mmHg  Pulse 71 /min  Temperature 97.7 F / 36.5 C  BMI 21.5 kg/m   MULTI-SYSTEM PHYSICAL EXAMINATION:    Constitutional: Well-nourished. No physical deformities. Normally developed. Good grooming.  Respiratory: No labored breathing, no use of accessory muscles.   Cardiovascular: Normal temperature, normal extremity pulses, no swelling, no varicosities.  Skin: No paleness, no jaundice, no cyanosis. No lesion, no ulcer, no rash.  Neurologic / Psychiatric: Oriented to time, oriented to place, oriented to person. No depression, no anxiety, no agitation.  Gastrointestinal: No mass, no tenderness, no rigidity, non obese abdomen. No CVA tenderness bilaterally  Eyes: Normal conjunctivae. Normal eyelids.  Musculoskeletal: Normal gait and station of head and neck.     Complexity of Data:  Source Of History:  Patient  Records Review:   Previous Doctor Records, Previous Hospital Records, Previous Patient Records  Urine Test Review:   Urinalysis  X-Ray Review: KUB: Reviewed Films. Discussed With Patient.  C.T. Abdomen/Pelvis: Reviewed Films. Reviewed Report. Discussed With Patient.     PROCEDURES:         KUB - 11914  A single view  of the abdomen is obtained.  KUB read demonstrates the Ureterovesicular junction 5 mm calculus.      Patient confirmed No Neulasta OnPro Device.            Urinalysis w/Scope Dipstick Dipstick Cont'd Micro  Color: Red Bilirubin: Neg mg/dL WBC/hpf: 10 - 09/FGH  Appearance: Slightly Cloudy Ketones: Trace mg/dL RBC/hpf: >82/XHB  Specific Gravity:  1.025 Blood: 3+ ery/uL Bacteria: Few (10-25/hpf)  pH: 6.0 Protein: 2+ mg/dL Cystals: NS (Not Seen)  Glucose: Neg mg/dL Urobilinogen: 0.2 mg/dL Casts: NS (Not Seen)    Nitrites: Neg Trichomonas: Not Present    Leukocyte Esterase: 2+ leu/uL Mucous: Not Present      Epithelial Cells: 0 - 5/hpf      Yeast: NS (Not Seen)      Sperm: Not Present    ASSESSMENT:      ICD-10 Details  1 GU:   Ureteral calculus - N20.1 Undiagnosed New Problem  2   Ureteral obstruction secondary to calculous - N13.2 Undiagnosed New Problem  3   Renal colic - N23 Undiagnosed New Problem  4   Dysuria - R30.0 Undiagnosed New Problem   PLAN:            Medications New Meds: Augmentin 875 mg-125 mg tablet 1 tablet PO BID   #14  0 Refill(s)  Tamsulosin Hcl 0.4 mg capsule 1 capsule PO Daily   #14  1 Refill(s)  Hydrocodone-Acetaminophen 5 mg-325 mg tablet 1 tablet PO Q 6 H PRN pain  #10  0 Refill(s)            Orders Labs Urine Culture  X-Rays: KUB          Schedule         Document Letter(s):  Created for Patient: Clinical Summary         Notes:   start Flomax. Refill of oxycodone provided. She will need a 24 hour urinalysis in the future. Strainer provided. Keflex started for dysuria. Send urine culture.   We discussed the management of urinary stones. These options include observation, ureteroscopy, and shockwave lithotripsy. We discussed which options are relevant to these particular stones. We discussed the natural history of stones as well as the complications of untreated stones and the impact on quality of life without treatment as well as with each of the above listed treatments. We also discussed the efficacy of each treatment in its ability to clear the stone burden. With any of these management options I discussed the signs and symptoms of infection and the need for emergent treatment should these be experienced. For each option we discussed the ability of each procedure to clear the patient of their  stone burden.   For observation I described the risks which include but are not limited to silent renal damage, life-threatening infection, need for emergent surgery, failure to pass stone, and pain.   For ureteroscopy I described the risks which include heart attack, stroke, pulmonary embolus, death, bleeding, infection, damage to contiguous structures, positioning injury, ureteral stricture, ureteral avulsion, ureteral injury, need for ureteral stent, inability to perform ureteroscopy, need for an interval procedure, inability to clear stone burden, stent discomfort and pain.   For shockwave lithotripsy I described the risks which include arrhythmia, kidney contusion, kidney hemorrhage, need for transfusion, pain, inability to break up stone, inability to pass stone fragments, Steinstrasse, infection associated with obstructing stones, need for different surgical procedure, need for repeat shockwave lithotripsy.   she elects to proceed with ESWL.  She will call if she passes the stone.  CC: Right ureteral calculus  HPI:  10/15/2019  24 year old female presented to the emergency department on 09/28/2019 with right-sided flank pain. She had a 5 mm obstructing right ureteral vesicular junction calculus with moderate right hydroureteronephrosis. She continues to have some right-sided flank pain. It is moderate in severity. Currently controlled. No nausea, vomiting, fever. She does have some dysuria. She has had 1 other stone in the past when she was pregnant. This was in 2017 and she passed it spontaneously.     ALLERGIES: None   MEDICATIONS: None   GU PSH: None   NON-GU PSH: Cholecystectomy (laparoscopic) - 09/20/2019     GU PMH: Renal calculus    NON-GU PMH: None   FAMILY HISTORY: 2 sons - Son Kidney Stones - Grandmother   SOCIAL HISTORY: Marital Status: Married Ethnicity: Not Hispanic Or Latino; Race: Black or African American Current Smoking Status: Patient has never smoked.    Tobacco Use Assessment Completed: Used Tobacco in last 30 days? Does drink.  Drinks 1 caffeinated drink per day.    REVIEW OF SYSTEMS:    GU Review Female:   Patient reports frequent urination, hard to postpone urination, burning /pain with urination, get up at night to urinate, and stream starts and stops. Patient denies leakage of urine, trouble starting your stream, have to strain to urinate, and being pregnant.  Gastrointestinal (Upper):   Patient denies nausea, vomiting, and indigestion/ heartburn.  Gastrointestinal (Lower):   Patient reports diarrhea. Patient denies constipation.  Constitutional:   Patient denies fever, night sweats, weight loss, and fatigue.  Skin:   Patient denies skin rash/ lesion and itching.  Eyes:   Patient denies blurred vision and double vision.  Ears/ Nose/ Throat:   Patient denies sinus problems and sore throat.  Hematologic/Lymphatic:   Patient denies swollen glands and easy bruising.  Cardiovascular:   Patient denies leg swelling and chest pains.  Respiratory:   Patient denies cough and shortness of breath.  Endocrine:   Patient denies excessive thirst.  Musculoskeletal:   Patient reports back pain. Patient denies joint pain.  Neurological:   Patient denies headaches and dizziness.  Psychologic:   Patient denies depression and anxiety.   Notes: UTI    VITAL SIGNS:      10/15/2019 01:55 PM  Weight 125 lb / 56.7 kg  Height 64 in / 162.56 cm  BP 123/76 mmHg  Pulse 71 /min  Temperature 97.7 F / 36.5 C  BMI 21.5 kg/m   MULTI-SYSTEM PHYSICAL EXAMINATION:    Constitutional: Well-nourished. No physical deformities. Normally developed. Good grooming.  Respiratory: No labored breathing, no use of accessory muscles.   Cardiovascular: Normal temperature, normal extremity pulses, no swelling, no varicosities.  Skin: No paleness, no jaundice, no cyanosis. No lesion, no ulcer, no rash.  Neurologic / Psychiatric: Oriented to time, oriented to place,  oriented to person. No depression, no anxiety, no agitation.  Gastrointestinal: No mass, no tenderness, no rigidity, non obese abdomen. No CVA tenderness bilaterally  Eyes: Normal conjunctivae. Normal eyelids.  Musculoskeletal: Normal gait and station of head and neck.     Complexity of Data:  Source Of History:  Patient  Records Review:   Previous Doctor Records, Previous Hospital Records, Previous Patient Records  Urine Test Review:   Urinalysis  X-Ray Review: KUB: Reviewed Films. Discussed With Patient.  C.T. Abdomen/Pelvis: Reviewed Films. Reviewed Report. Discussed With Patient.     PROCEDURES:  KUB - [id number]  A single view of the abdomen is obtained.  KUB read demonstrates the Ureterovesicular junction 5 mm calculus.      Patient confirmed No Neulasta OnPro Device.            Urinalysis w/Scope Dipstick Dipstick Cont'd Micro  Color: Red Bilirubin: Neg mg/dL WBC/hpf: 10 - 30/QTM  Appearance: Slightly Cloudy Ketones: Trace mg/dL RBC/hpf: >22/QJF  Specific Gravity: 1.025 Blood: 3+ ery/uL Bacteria: Few (10-25/hpf)  pH: 6.0 Protein: 2+ mg/dL Cystals: NS (Not Seen)  Glucose: Neg mg/dL Urobilinogen: 0.2 mg/dL Casts: NS (Not Seen)    Nitrites: Neg Trichomonas: Not Present    Leukocyte Esterase: 2+ leu/uL Mucous: Not Present      Epithelial Cells: 0 - 5/hpf      Yeast: NS (Not Seen)      Sperm: Not Present    ASSESSMENT:      ICD-10 Details  1 GU:   Ureteral calculus - N20.1 Undiagnosed New Problem  2   Ureteral obstruction secondary to calculous - N13.2 Undiagnosed New Problem  3   Renal colic - N23 Undiagnosed New Problem  4   Dysuria - R30.0 Undiagnosed New Problem   PLAN:            Medications New Meds: Augmentin 875 mg-125 mg tablet 1 tablet PO BID   #14  0 Refill(s)  Tamsulosin Hcl 0.4 mg capsule 1 capsule PO Daily   #14  1 Refill(s)  Hydrocodone-Acetaminophen 5 mg-325 mg tablet 1 tablet PO Q 6 H PRN pain  #10  0 Refill(s)            Orders Labs  Urine Culture  X-Rays: KUB          Schedule         Document Letter(s):  Created for Patient: Clinical Summary         Notes:   start Flomax. Refill of oxycodone provided. She will need a 24 hour urinalysis in the future. Strainer provided. Keflex started for dysuria. Send urine culture.   We discussed the management of urinary stones. These options include observation, ureteroscopy, and shockwave lithotripsy. We discussed which options are relevant to these particular stones. We discussed the natural history of stones as well as the complications of untreated stones and the impact on quality of life without treatment as well as with each of the above listed treatments. We also discussed the efficacy of each treatment in its ability to clear the stone burden. With any of these management options I discussed the signs and symptoms of infection and the need for emergent treatment should these be experienced. For each option we discussed the ability of each procedure to clear the patient of their stone burden.   For observation I described the risks which include but are not limited to silent renal damage, life-threatening infection, need for emergent surgery, failure to pass stone, and pain.   For ureteroscopy I described the risks which include heart attack, stroke, pulmonary embolus, death, bleeding, infection, damage to contiguous structures, positioning injury, ureteral stricture, ureteral avulsion, ureteral injury, need for ureteral stent, inability to perform ureteroscopy, need for an interval procedure, inability to clear stone burden, stent discomfort and pain.   For shockwave lithotripsy I described the risks which include arrhythmia, kidney contusion, kidney hemorrhage, need for transfusion, pain, inability to break up stone, inability to pass stone fragments, Steinstrasse, infection associated with obstructing stones, need for different surgical procedure, need for repeat shockwave  lithotripsy.  she elects to proceed with ESWL. She will call if she passes the stone.

## 2019-11-04 ENCOUNTER — Ambulatory Visit (HOSPITAL_BASED_OUTPATIENT_CLINIC_OR_DEPARTMENT_OTHER): Admission: RE | Admit: 2019-11-04 | Payer: BC Managed Care – PPO | Source: Home / Self Care | Admitting: Urology

## 2019-11-04 SURGERY — LITHOTRIPSY, ESWL
Anesthesia: LOCAL | Laterality: Right

## 2019-12-14 ENCOUNTER — Encounter (HOSPITAL_COMMUNITY): Payer: Self-pay | Admitting: *Deleted

## 2019-12-14 ENCOUNTER — Inpatient Hospital Stay (HOSPITAL_COMMUNITY)
Admission: AD | Admit: 2019-12-14 | Discharge: 2019-12-15 | Disposition: A | Discharge status: Home / Self Care | Payer: BC Managed Care – PPO | Attending: Obstetrics & Gynecology | Admitting: Obstetrics & Gynecology

## 2019-12-14 ENCOUNTER — Inpatient Hospital Stay (HOSPITAL_COMMUNITY): Payer: BC Managed Care – PPO

## 2019-12-14 DIAGNOSIS — Z3A01 Less than 8 weeks gestation of pregnancy: Secondary | ICD-10-CM | POA: Insufficient documentation

## 2019-12-14 DIAGNOSIS — O209 Hemorrhage in early pregnancy, unspecified: Secondary | ICD-10-CM | POA: Diagnosis present

## 2019-12-14 DIAGNOSIS — N939 Abnormal uterine and vaginal bleeding, unspecified: Secondary | ICD-10-CM

## 2019-12-14 DIAGNOSIS — O3680X0 Pregnancy with inconclusive fetal viability, not applicable or unspecified: Secondary | ICD-10-CM | POA: Diagnosis not present

## 2019-12-14 DIAGNOSIS — Z3183 Encounter for assisted reproductive fertility procedure cycle: Secondary | ICD-10-CM

## 2019-12-14 LAB — CBC
HCT: 36.6 % (ref 36.0–46.0)
Hemoglobin: 12.4 g/dL (ref 12.0–15.0)
MCH: 31.4 pg (ref 26.0–34.0)
MCHC: 33.9 g/dL (ref 30.0–36.0)
MCV: 92.7 fL (ref 80.0–100.0)
Platelets: 259 10*3/uL (ref 150–400)
RBC: 3.95 MIL/uL (ref 3.87–5.11)
RDW: 12.5 % (ref 11.5–15.5)
WBC: 11.5 10*3/uL — ABNORMAL HIGH (ref 4.0–10.5)
nRBC: 0 % (ref 0.0–0.2)

## 2019-12-14 LAB — HCG, QUANTITATIVE, PREGNANCY: hCG, Beta Chain, Quant, S: 289 m[IU]/mL — ABNORMAL HIGH (ref <5)

## 2019-12-14 NOTE — MAU Note (Signed)
PT SAYS SHE STARTED VAG BLEEDING LAST NIGHT -.  IN TRIAGE - SMALL AMT ON BLUE WASH CLOTH .  CRAMPING - TONIGHT .

## 2019-12-14 NOTE — MAU Provider Note (Signed)
History     CSN: [id number]  Arrival date and time: 12/14/19 2211   First Provider Initiated Contact with Patient 12/14/19 2257      Chief Complaint  Patient presents with  . Vaginal Bleeding   HPI Robin Craig is a 24 y.o. [id number] at [redacted]w[redacted]d who presents to MAU with chief complaint of vaginal bleeding. This is a new problem, onset 12/14/2019. Patient and her partner noted scant vaginal bleeding after intercourse involving a penetrative toy. Patient states her bleeding was light and painless but worsened during her work day. She has not donned a pad but reports bleeding through several pair of underwear throughout her work day. She reports that she was feeling very stressed and masturbated shortly before coming to MAU.  Patient is S/P IVF with Dr. April Manson, transfer date 14 days ago. She states her Quant hCG was 172 on Monday 12/13/2019.  OB History    Gravida  4   Para  2   Term  2   Preterm      AB  1   Living  2     SAB  1   TAB      Ectopic      Multiple  0   Live Births  2           Past Medical History:  Diagnosis Date  . Chlamydia   . Chronic kidney disease    kidney stones  . GC (gonococcus infection)   . UTI (urinary tract infection)   . Yeast vaginitis     Past Surgical History:  Procedure Laterality Date  . CHOLECYSTECTOMY  2021  . NO PAST SURGERIES    . WISDOM TOOTH EXTRACTION  2010    Family History  Problem Relation Age of Onset  . Asthma Sister   . Cancer Neg Hx   . Diabetes Neg Hx   . Hypertension Neg Hx     Social History   Tobacco Use  . Smoking status: Never Smoker  . Smokeless tobacco: Never Used  Vaping Use  . Vaping Use: Never used  Substance Use Topics  . Alcohol use: No  . Drug use: No    Allergies:  Allergies  Allergen Reactions  . Flagyl [Metronidazole] Hives and Other (See Comments)    Dizziness   Has tolerated since  . Latex Other (See Comments)    Bacterial vaginitis    Medications Prior to  Admission  Medication Sig Dispense Refill Last Dose  . metroNIDAZOLE (FLAGYL) 500 MG tablet Take 1 tablet (500 mg total) by mouth 2 (two) times daily. (Patient not taking: Reported on 09/28/2019) 14 tablet 0   . ondansetron (ZOFRAN) 4 MG tablet Take 1 tablet (4 mg total) by mouth every 6 (six) hours. 12 tablet 0   . oxyCODONE-acetaminophen (PERCOCET/ROXICET) 5-325 MG tablet Take 1 tablet by mouth every 8 (eight) hours as needed for severe pain. 8 tablet 0     Review of Systems  Genitourinary: Positive for vaginal bleeding.  All other systems reviewed and are negative.  Physical Exam   Blood pressure 111/77, pulse 70, temperature 98.3 F (36.8 C), temperature source Oral, resp. rate 18, height 5\' 4"  (1.626 m), weight 60.1 kg, last menstrual period 11/13/2019, currently breastfeeding.  Physical Exam Vitals and nursing note reviewed. Exam conducted with a chaperone present.  Constitutional:      Appearance: Normal appearance.  HENT:     Mouth/Throat:     Mouth: Mucous membranes are moist.  Cardiovascular:  Rate and Rhythm: Normal rate.     Pulses: Normal pulses.  Pulmonary:     Effort: Pulmonary effort is normal.  Abdominal:     General: Abdomen is flat.     Tenderness: There is no abdominal tenderness.  Genitourinary:    Comments: Pelvic deferred pending Hgb and Quant results Skin:    General: Skin is warm and dry.     Capillary Refill: Capillary refill takes less than 2 seconds.  Neurological:     General: No focal deficit present.     Mental Status: She is alert.  Psychiatric:        Mood and Affect: Mood normal.        Behavior: Behavior normal.        Thought Content: Thought content normal.        Judgment: Judgment normal.     MAU Course/MDM  Procedures  Patient Vitals for the past 24 hrs:  BP Temp Temp src Pulse Resp Height Weight  12/15/19 0036 113/70 -- -- 79 -- -- --  12/14/19 2240 111/77 98.3 F (36.8 C) Oral 70 18 5\' 4"  (1.626 m) 60.1 kg   Results  for orders placed or performed during the hospital encounter of 12/14/19 (from the past 24 hour(s))  hCG, quantitative, pregnancy     Status: Abnormal   Collection Time: 12/14/19 11:10 PM  Result Value Ref Range   hCG, Beta Chain, Quant, S 289 (H) <5 mIU/mL  CBC     Status: Abnormal   Collection Time: 12/14/19 11:10 PM  Result Value Ref Range   WBC 11.5 (H) 4.0 - 10.5 K/uL   RBC 3.95 3.87 - 5.11 MIL/uL   Hemoglobin 12.4 12.0 - 15.0 g/dL   HCT 13/02/21 36 - 46 %   MCV 92.7 80.0 - 100.0 fL   MCH 31.4 26.0 - 34.0 pg   MCHC 33.9 30.0 - 36.0 g/dL   RDW 99.2 42.6 - 83.4 %   Platelets 259 150 - 400 K/uL   nRBC 0.0 0.0 - 0.2 %   19.6 OB LESS THAN 14 WEEKS WITH OB TRANSVAGINAL  Result Date: 12/15/2019 CLINICAL DATA:  Last menstrual period 11/13/2019. Gestational age by last menstrual period of 4 weeks and 3 days. Estimated due date by last menstrual period of 08/19/2020. Patient presenting with heavy bleeding since last night. No pain. IVF transfer on 12/04/2019. G4P2 SAB 1. EXAM: OBSTETRIC <14 WK 12/06/2019 AND TRANSVAGINAL OB US TECHNIQUE: Both transabdominal and transvaginal ultrasound examinations were performed for complete evaluation of the gestation as well as the maternal uterus, adnexal regions, and pelvic cul-de-sac. Transvaginal technique was performed to assess early pregnancy. COMPARISON:  None. FINDINGS: Intrauterine gestational sac: None Yolk sac:  Not Visualized. Embryo:  Not Visualized. Cardiac Activity: Not Visualized. Subchorionic hemorrhage:  None visualized. Maternal uterus/adnexae: Bilateral ovaries are unremarkable. The uterus is unremarkable. Other: No free pelvic fluid. IMPRESSION: No intrauterine gestational sac, yolk sac, fetal pole, or cardiac activity yet visualized. Findings could result from a probable early pregnancy. Recommend follow-up quantitative B-HCG levels and follow-up US in 14 days to assess viability. This recommendation follows SRU consensus guidelines: Diagnostic Criteria  for Nonviable Pregnancy Early in the First Trimester. [place] Med 20132014. Electronically Signed   By: ; [id number] M.D.   On: 12/15/2019 00:02   Assessment and Plan  --24 y.o. 22 with pregnancy of unknown location --Encouraging rise in Quant hCG 172 now 289 roughly 30 hours later --Postcoital bleeding --Blood type B POS --  Advised pelvic rest  F/U: --Patient originally scheduled to see Dr. April Manson 12/16/2019  Calvert Cantor, CNM 12/15/2019, 1:32 AM

## 2019-12-15 DIAGNOSIS — Z3A01 Less than 8 weeks gestation of pregnancy: Secondary | ICD-10-CM

## 2019-12-15 DIAGNOSIS — O3680X0 Pregnancy with inconclusive fetal viability, not applicable or unspecified: Secondary | ICD-10-CM

## 2019-12-15 NOTE — Discharge Instructions (Signed)

## 2020-02-20 ENCOUNTER — Other Ambulatory Visit: Payer: Self-pay

## 2020-02-20 ENCOUNTER — Ambulatory Visit (HOSPITAL_COMMUNITY)
Admission: EM | Admit: 2020-02-20 | Discharge: 2020-02-20 | Disposition: A | Discharge status: Home / Self Care | Payer: BC Managed Care – PPO | Attending: Family Medicine | Admitting: Family Medicine

## 2020-02-20 ENCOUNTER — Encounter (HOSPITAL_COMMUNITY): Payer: Self-pay | Admitting: *Deleted

## 2020-02-20 DIAGNOSIS — U071 COVID-19: Secondary | ICD-10-CM | POA: Insufficient documentation

## 2020-02-20 DIAGNOSIS — J069 Acute upper respiratory infection, unspecified: Secondary | ICD-10-CM | POA: Diagnosis not present

## 2020-02-20 LAB — SARS CORONAVIRUS 2 (TAT 6-24 HRS): SARS Coronavirus 2: POSITIVE — AB

## 2020-02-20 NOTE — ED Triage Notes (Signed)
Pt reports her wife has a positive  Test for COVID 02-12-20. Pt started Sx's 4 days ago.

## 2020-02-20 NOTE — ED Provider Notes (Signed)
[facility]    CSN: [id number] Arrival date & time: 02/20/20  1126      History   Chief Complaint Chief Complaint  Patient presents with  . Cough  . Sore Throat  . Otalgia    HPI Robin Craig is a 25 y.o. female.   Here today with 4 day history of cough, sore throat, ear pressure, headache. Denies fever, chills, CP, SOB, abdominal pain, N/V/D. Not taking anything OTC for sxs. Wife positive for COVID. UTD on COVID vaccines. No known chronic medical conditions.      Past Medical History:  Diagnosis Date  . Chlamydia   . Chronic kidney disease    kidney stones  . GC (gonococcus infection)   . UTI (urinary tract infection)   . Yeast vaginitis     Patient Active Problem List   Diagnosis Date Noted  . Vaginal delivery 08/10/2016  . History of kidney stones--early pregnancy 07/05/2016    Past Surgical History:  Procedure Laterality Date  . CHOLECYSTECTOMY  2021  . NO PAST SURGERIES    . WISDOM TOOTH EXTRACTION  2010    OB History    Gravida  4   Para  2   Term  2   Preterm      AB  1   Living  2     SAB  1   IAB      Ectopic      Multiple  0   Live Births  2            Home Medications    Prior to Admission medications   Medication Sig Start Date End Date Taking? Authorizing Provider  ondansetron (ZOFRAN) 4 MG tablet Take 1 tablet (4 mg total) by mouth every 6 (six) hours. 09/28/19   Couture, Cortni S, PA-C  oxyCODONE-acetaminophen (PERCOCET/ROXICET) 5-325 MG tablet Take 1 tablet by mouth every 8 (eight) hours as needed for severe pain. 09/28/19   Couture, Cortni S, PA-C  Norethin Ace-Eth Estrad-FE 1-20 MG-MCG(24) TABS Take 1 tablet by mouth daily. 06/13/10 04/17/11  Ta, Cat, MD    Family History Family History  Problem Relation Age of Onset  . Asthma Sister   . Cancer Neg Hx   . Diabetes Neg Hx   . Hypertension Neg Hx     Social History Social History   Tobacco Use  . Smoking status: Never Smoker  . Smokeless  tobacco: Never Used  Vaping Use  . Vaping Use: Never used  Substance Use Topics  . Alcohol use: No  . Drug use: No     Allergies   Flagyl [metronidazole] and Latex   Review of Systems Review of Systems PER HPI    Physical Exam Triage Vital Signs ED Triage Vitals  Enc Vitals Group     BP 02/20/20 1300 114/71     Pulse Rate 02/20/20 1300 68     Resp --      Temp 02/20/20 1300 98.5 F (36.9 C)     Temp Source 02/20/20 1300 Oral     SpO2 02/20/20 1300 100 %     Weight --      Height --      Head Circumference --      Peak Flow --      Pain Score 02/20/20 1255 5     Pain Loc --      Pain Edu? --      Excl. in GC? --    No data  found.  Updated Vital Signs BP 114/71 (BP Location: Right Arm)   Pulse 68   Temp 98.5 F (36.9 C) (Oral)   LMP 01/26/2020   SpO2 100%   Breastfeeding No   Visual Acuity Right Eye Distance:   Left Eye Distance:   Bilateral Distance:    Right Eye Near:   Left Eye Near:    Bilateral Near:     Physical Exam Vitals and nursing note reviewed.  Constitutional:      Appearance: Normal appearance. She is not ill-appearing.  HENT:     Head: Atraumatic.     Right Ear: Tympanic membrane normal.     Left Ear: Tympanic membrane normal.     Nose: Rhinorrhea present.     Mouth/Throat:     Mouth: Mucous membranes are moist.     Pharynx: Oropharynx is clear. Posterior oropharyngeal erythema present.  Eyes:     Extraocular Movements: Extraocular movements intact.     Conjunctiva/sclera: Conjunctivae normal.  Cardiovascular:     Rate and Rhythm: Normal rate and regular rhythm.     Heart sounds: Normal heart sounds.  Pulmonary:     Effort: Pulmonary effort is normal. No respiratory distress.     Breath sounds: Normal breath sounds. No wheezing or rales.  Abdominal:     General: Bowel sounds are normal. There is no distension.     Palpations: Abdomen is soft.     Tenderness: There is no abdominal tenderness. There is no guarding.   Musculoskeletal:        General: Normal range of motion.     Cervical back: Normal range of motion and neck supple.  Skin:    General: Skin is warm and dry.  Neurological:     Mental Status: She is alert and oriented to person, place, and time.  Psychiatric:        Mood and Affect: Mood normal.        Thought Content: Thought content normal.        Judgment: Judgment normal.      UC Treatments / Results  Labs (all labs ordered are listed, but only abnormal results are displayed) Labs Reviewed  SARS CORONAVIRUS 2 (TAT 6-24 HRS)    EKG   Radiology No results found.  Procedures Procedures (including critical care time)  Medications Ordered in UC Medications - No data to display  Initial Impression / Assessment and Plan / UC Course  I have reviewed the triage vital signs and the nursing notes.  Pertinent labs & imaging results that were available during my care of the patient were reviewed by me and considered in my medical decision making (see chart for details).     Labs and exam reassuring, COVID pcr pending, discussed OTC remedies, supportive home care. Isolation, work note given. Return for worsening sxs.   Final Clinical Impressions(s) / UC Diagnoses   Final diagnoses:  Viral URI with cough   Discharge Instructions   None    ED Prescriptions    None     PDMP not reviewed this encounter.   Particia Nearing, [place] 02/20/20 1344

## 2020-12-26 ENCOUNTER — Ambulatory Visit (HOSPITAL_COMMUNITY)
Admission: EM | Admit: 2020-12-26 | Discharge: 2020-12-26 | Disposition: A | Discharge status: Home / Self Care | Payer: BC Managed Care – PPO | Attending: Emergency Medicine | Admitting: Emergency Medicine

## 2020-12-26 ENCOUNTER — Encounter (HOSPITAL_COMMUNITY): Payer: Self-pay

## 2020-12-26 ENCOUNTER — Other Ambulatory Visit: Payer: Self-pay

## 2020-12-26 DIAGNOSIS — N760 Acute vaginitis: Secondary | ICD-10-CM | POA: Diagnosis not present

## 2020-12-26 DIAGNOSIS — F4321 Adjustment disorder with depressed mood: Secondary | ICD-10-CM | POA: Insufficient documentation

## 2020-12-26 LAB — POCT URINALYSIS DIPSTICK, ED / UC
Bilirubin Urine: NEGATIVE
Glucose, UA: NEGATIVE mg/dL
Hgb urine dipstick: NEGATIVE
Ketones, ur: NEGATIVE mg/dL
Nitrite: NEGATIVE
Protein, ur: 30 mg/dL — AB
Specific Gravity, Urine: 1.02 (ref 1.005–1.030)
Urobilinogen, UA: 1 mg/dL (ref 0.0–1.0)
pH: 7 (ref 5.0–8.0)

## 2020-12-26 LAB — POC URINE PREG, ED: Preg Test, Ur: NEGATIVE

## 2020-12-26 NOTE — ED Triage Notes (Signed)
Pt reports vaginal discomfort after she changed detergent. Denies vaginal discharge, itching, dysuria.

## 2020-12-26 NOTE — Discharge Instructions (Addendum)
Urine tests are negative - you do not have a UTI and your pregnancy test is negative. You may want to recheck a pregnancy test in 2 or so weeks, especially if you miss a menstrual period. You will get a call if any test results are positive. You will not be called if all results are negative but you can check results in MyChart if you have a MyChart account.

## 2020-12-26 NOTE — ED Provider Notes (Signed)
[facility]    CSN: [id number] Arrival date & time: 12/26/20  5638      History   Chief Complaint Chief Complaint  Patient presents with   vaginal discomfort    HPI Robin Craig is a 25 y.o. female.  Patient reports unprotected sex with a former partner about a week ago.  Since then she has had vaginal itching and irritation.  Denies vaginal discharge or odor.  Denies dysuria, abdominal pain, nausea, vomiting.  Denies fever or chills.  Wonders if vaginal discomfort could be caused because she changed detergent several weeks ago, however she does not have symptoms anywhere except intravaginally.  Also reports her wife of 4 years committed suicide over the summer.  Declines offer of behavioral health referral.  HPI  Past Medical History:  Diagnosis Date   Chlamydia    Chronic kidney disease    kidney stones   GC (gonococcus infection)    UTI (urinary tract infection)    Yeast vaginitis     Patient Active Problem List   Diagnosis Date Noted   Vaginal delivery 08/10/2016   History of kidney stones--early pregnancy 07/05/2016    Past Surgical History:  Procedure Laterality Date   CHOLECYSTECTOMY  2021   NO PAST SURGERIES     WISDOM TOOTH EXTRACTION  2010    OB History     Gravida  4   Para  2   Term  2   Preterm      AB  1   Living  2      SAB  1   IAB      Ectopic      Multiple  0   Live Births  2            Home Medications    Prior to Admission medications   Medication Sig Start Date End Date Taking? Authorizing Provider  ondansetron (ZOFRAN) 4 MG tablet Take 1 tablet (4 mg total) by mouth every 6 (six) hours. 09/28/19   Couture, Cortni S, PA-C  oxyCODONE-acetaminophen (PERCOCET/ROXICET) 5-325 MG tablet Take 1 tablet by mouth every 8 (eight) hours as needed for severe pain. 09/28/19   Couture, Cortni S, PA-C  Norethin Ace-Eth Estrad-FE 1-20 MG-MCG(24) TABS Take 1 tablet by mouth daily. 06/13/10 04/17/11  Ta, Cat, MD     Family History Family History  Problem Relation Age of Onset   Asthma Sister    Cancer Neg Hx    Diabetes Neg Hx    Hypertension Neg Hx     Social History Social History   Tobacco Use   Smoking status: Never   Smokeless tobacco: Never  Vaping Use   Vaping Use: Never used  Substance Use Topics   Alcohol use: No   Drug use: No     Allergies   Flagyl [metronidazole] and Latex   Review of Systems Review of Systems   Physical Exam Triage Vital Signs ED Triage Vitals  Enc Vitals Group     BP 12/26/20 0922 114/69     Pulse Rate 12/26/20 0922 64     Resp 12/26/20 0922 16     Temp 12/26/20 0922 98.1 F (36.7 C)     Temp Source 12/26/20 0922 Oral     SpO2 12/26/20 0922 98 %     Weight --      Height --      Head Circumference --      Peak Flow --      Pain  Score 12/26/20 0921 0     Pain Loc --      Pain Edu? --      Excl. in GC? --    No data found.  Updated Vital Signs BP 114/69 (BP Location: Left Arm)   Pulse 64   Temp 98.1 F (36.7 C) (Oral)   Resp 16   LMP  (Within Weeks) Comment: 3 weeks  SpO2 98%   Visual Acuity Right Eye Distance:   Left Eye Distance:   Bilateral Distance:    Right Eye Near:   Left Eye Near:    Bilateral Near:     Physical Exam Constitutional:      General: She is not in acute distress.    Appearance: Normal appearance. She is not ill-appearing.  Pulmonary:     Effort: Pulmonary effort is normal.  Neurological:     Mental Status: She is alert and oriented to person, place, and time.  Psychiatric:        Mood and Affect: Mood normal.        Thought Content: Thought content normal.     UC Treatments / Results  Labs (all labs ordered are listed, but only abnormal results are displayed) Labs Reviewed  POCT URINALYSIS DIPSTICK, ED / UC - Abnormal; Notable for the following components:      Result Value   Protein, ur 30 (*)    Leukocytes,Ua SMALL (*)    All other components within normal limits  POC URINE  PREG, ED  CERVICOVAGINAL ANCILLARY ONLY    EKG   Radiology No results found.  Procedures Procedures (including critical care time)  Medications Ordered in UC Medications - No data to display  Initial Impression / Assessment and Plan / UC Course  I have reviewed the triage vital signs and the nursing notes.  Pertinent labs & imaging results that were available during my care of the patient were reviewed by me and considered in my medical decision making (see chart for details).    UA and U preg tests are negative.  Advised patient if she is prone to bacterial vaginosis with intercourse that she should use condoms to prevent it.  Final Clinical Impressions(s) / UC Diagnoses   Final diagnoses:  Vaginitis and vulvovaginitis  Grief     Discharge Instructions      Urine tests are negative - you do not have a UTI and your pregnancy test is negative. You may want to recheck a pregnancy test in 2 or so weeks, especially if you miss a menstrual period. You will get a call if any test results are positive. You will not be called if all results are negative but you can check results in MyChart if you have a MyChart account.      ED Prescriptions   None    PDMP not reviewed this encounter.   Cathlyn Parsons, NP 12/26/20 1013

## 2020-12-27 LAB — CERVICOVAGINAL ANCILLARY ONLY
Bacterial Vaginitis (gardnerella): NEGATIVE
Candida Glabrata: NEGATIVE
Candida Vaginitis: POSITIVE — AB
Chlamydia: NEGATIVE
Comment: NEGATIVE
Comment: NEGATIVE
Comment: NEGATIVE
Comment: NEGATIVE
Comment: NEGATIVE
Comment: NORMAL
Neisseria Gonorrhea: NEGATIVE
Trichomonas: NEGATIVE

## 2020-12-28 ENCOUNTER — Telehealth (HOSPITAL_COMMUNITY): Payer: Self-pay | Admitting: Emergency Medicine

## 2020-12-28 MED ORDER — FLUCONAZOLE 150 MG PO TABS: 150.0000 mg | ORAL_TABLET | Freq: Once | ORAL | 0 refills | Status: AC

## 2021-01-19 ENCOUNTER — Encounter (HOSPITAL_COMMUNITY): Payer: Self-pay | Admitting: Emergency Medicine

## 2021-01-19 ENCOUNTER — Other Ambulatory Visit: Payer: Self-pay

## 2021-01-19 ENCOUNTER — Ambulatory Visit (HOSPITAL_COMMUNITY)
Admission: EM | Admit: 2021-01-19 | Discharge: 2021-01-19 | Disposition: A | Discharge status: Home / Self Care | Payer: BC Managed Care – PPO | Attending: Emergency Medicine | Admitting: Emergency Medicine

## 2021-01-19 DIAGNOSIS — J069 Acute upper respiratory infection, unspecified: Secondary | ICD-10-CM | POA: Insufficient documentation

## 2021-01-19 DIAGNOSIS — Z20822 Contact with and (suspected) exposure to covid-19: Secondary | ICD-10-CM | POA: Diagnosis not present

## 2021-01-19 MED ORDER — PROMETHAZINE-DM 6.25-15 MG/5ML PO SYRP: 5.0000 mL | ORAL_SOLUTION | Freq: Four times a day (QID) | ORAL | 0 refills | Status: DC | PRN

## 2021-01-19 MED ORDER — AZELASTINE HCL 0.1 % NA SOLN: 2.0000 | Freq: Two times a day (BID) | NASAL | 0 refills | Status: AC

## 2021-01-19 MED ORDER — FLUTICASONE PROPIONATE 50 MCG/ACT NA SUSP: 2.0000 | Freq: Every day | NASAL | 0 refills | Status: AC

## 2021-01-19 NOTE — ED Provider Notes (Signed)
[facility]    CSN: [id number] Arrival date & time: 01/19/21  1508      History   Chief Complaint Chief Complaint  Patient presents with   Cough   Nasal Congestion   Diarrhea    HPI Robin Craig is a 25 y.o. female.  Patient reports symptoms starting 01/13/2021.  Started with a scratchy throat and progressed to cough, congestion.  Denies body aches or fever.  Reports chills.  Denies GI symptoms.  Did not get a flu shot.  Has not tested self for COVID.  Reports coworker had influenza last week.   Cough Associated symptoms: chills, rhinorrhea and sore throat   Associated symptoms: no fever, no myalgias, no shortness of breath and no wheezing   Diarrhea Associated symptoms: chills   Associated symptoms: no abdominal pain, no fever, no myalgias and no vomiting    Past Medical History:  Diagnosis Date   Chlamydia    Chronic kidney disease    kidney stones   GC (gonococcus infection)    UTI (urinary tract infection)    Yeast vaginitis     Patient Active Problem List   Diagnosis Date Noted   Vaginal delivery 08/10/2016   History of kidney stones--early pregnancy 07/05/2016    Past Surgical History:  Procedure Laterality Date   CHOLECYSTECTOMY  2021   NO PAST SURGERIES     WISDOM TOOTH EXTRACTION  2010    OB History     Gravida  4   Para  2   Term  2   Preterm      AB  1   Living  2      SAB  1   IAB      Ectopic      Multiple  0   Live Births  2            Home Medications    Prior to Admission medications   Medication Sig Start Date End Date Taking? Authorizing Provider  azelastine (ASTELIN) 0.1 % nasal spray Place 2 sprays into both nostrils 2 (two) times daily. Use in each nostril as directed 01/19/21  Yes Lucina Betty, Marzella Schlein, NP  fluticasone (FLONASE) 50 MCG/ACT nasal spray Place 2 sprays into both nostrils daily. 01/19/21  Yes Cathlyn Parsons, NP  promethazine-dextromethorphan (PROMETHAZINE-DM) 6.25-15 MG/5ML syrup Take 5  mLs by mouth 4 (four) times daily as needed for cough. 01/19/21  Yes Cathlyn Parsons, NP  ondansetron (ZOFRAN) 4 MG tablet Take 1 tablet (4 mg total) by mouth every 6 (six) hours. 09/28/19   Couture, Cortni S, PA-C  oxyCODONE-acetaminophen (PERCOCET/ROXICET) 5-325 MG tablet Take 1 tablet by mouth every 8 (eight) hours as needed for severe pain. 09/28/19   Couture, Cortni S, PA-C  Norethin Ace-Eth Estrad-FE 1-20 MG-MCG(24) TABS Take 1 tablet by mouth daily. 06/13/10 04/17/11  Ta, Cat, MD    Family History Family History  Problem Relation Age of Onset   Asthma Sister    Cancer Neg Hx    Diabetes Neg Hx    Hypertension Neg Hx     Social History Social History   Tobacco Use   Smoking status: Never   Smokeless tobacco: Never  Vaping Use   Vaping Use: Never used  Substance Use Topics   Alcohol use: No   Drug use: No     Allergies   Flagyl [metronidazole] and Latex   Review of Systems Review of Systems  Constitutional:  Positive for chills. Negative for fever.  HENT:  Positive for congestion, postnasal drip, rhinorrhea and sore throat.   Respiratory:  Positive for cough. Negative for shortness of breath and wheezing.   Gastrointestinal:  Negative for abdominal pain, diarrhea, nausea and vomiting.  Musculoskeletal:  Negative for myalgias.    Physical Exam Triage Vital Signs ED Triage Vitals  Enc Vitals Group     BP 01/19/21 1643 114/77     Pulse Rate 01/19/21 1643 61     Resp 01/19/21 1643 16     Temp 01/19/21 1643 98.7 F (37.1 C)     Temp Source 01/19/21 1643 Oral     SpO2 01/19/21 1643 99 %     Weight --      Height --      Head Circumference --      Peak Flow --      Pain Score 01/19/21 1642 0     Pain Loc --      Pain Edu? --      Excl. in GC? --    No data found.  Updated Vital Signs BP 114/77 (BP Location: Right Arm)   Pulse 61   Temp 98.7 F (37.1 C) (Oral)   Resp 16   SpO2 99%   Visual Acuity Right Eye Distance:   Left Eye Distance:   Bilateral  Distance:    Right Eye Near:   Left Eye Near:    Bilateral Near:     Physical Exam Constitutional:      General: She is in acute distress.     Appearance: Normal appearance. She is ill-appearing.  HENT:     Right Ear: Tympanic membrane, ear canal and external ear normal.     Left Ear: Tympanic membrane, ear canal and external ear normal.     Nose: Congestion present.     Right Sinus: No maxillary sinus tenderness or frontal sinus tenderness.     Left Sinus: No maxillary sinus tenderness or frontal sinus tenderness.     Mouth/Throat:     Mouth: Mucous membranes are moist.     Pharynx: Oropharynx is clear. No oropharyngeal exudate or posterior oropharyngeal erythema.  Cardiovascular:     Rate and Rhythm: Normal rate and regular rhythm.  Pulmonary:     Effort: Pulmonary effort is normal.     Breath sounds: Normal breath sounds.  Neurological:     Mental Status: She is alert.     UC Treatments / Results  Labs (all labs ordered are listed, but only abnormal results are displayed) Labs Reviewed  SARS CORONAVIRUS 2 (TAT 6-24 HRS)    EKG   Radiology No results found.  Procedures Procedures (including critical care time)  Medications Ordered in UC Medications - No data to display  Initial Impression / Assessment and Plan / UC Course  I have reviewed the triage vital signs and the nursing notes.  Pertinent labs & imaging results that were available during my care of the patient were reviewed by me and considered in my medical decision making (see chart for details).    COVID test pending.  Reviewed supportive care measures.  Final Clinical Impressions(s) / UC Diagnoses   Final diagnoses:  Viral upper respiratory tract infection     Discharge Instructions      Look at the cold medicine you are taking and see if it has a decongestant in it and guaifenesin (brand name Mucinex). If it doesn't, consider switching to Mucinex D medicine.   Herbal tea with honey and  lemon  Use saline nasal spray several times a day but not at the same time as the prescription sprays.   You will get a call if tests are positive, you will not get a call if tests are negative but you can check results in MyChart if you have a MyChart account.     ED Prescriptions     Medication Sig Dispense Auth. Provider   fluticasone (FLONASE) 50 MCG/ACT nasal spray Place 2 sprays into both nostrils daily. 16 g Cathlyn Parsons, NP   azelastine (ASTELIN) 0.1 % nasal spray Place 2 sprays into both nostrils 2 (two) times daily. Use in each nostril as directed 30 mL Cathlyn Parsons, NP   promethazine-dextromethorphan (PROMETHAZINE-DM) 6.25-15 MG/5ML syrup Take 5 mLs by mouth 4 (four) times daily as needed for cough. 120 mL Cathlyn Parsons, NP      PDMP not reviewed this encounter.   Cathlyn Parsons, NP 01/19/21 1744

## 2021-01-19 NOTE — ED Triage Notes (Signed)
Pt reports having chills, congestion, cough, diarrhea since end last week. Coworker had flu

## 2021-01-19 NOTE — Discharge Instructions (Addendum)
Look at the cold medicine you are taking and see if it has a decongestant in it and guaifenesin (brand name Mucinex). If it doesn't, consider switching to Mucinex D medicine.   Herbal tea with honey and lemon  Use saline nasal spray several times a day but not at the same time as the prescription sprays.   You will get a call if tests are positive, you will not get a call if tests are negative but you can check results in MyChart if you have a MyChart account.

## 2021-01-20 LAB — SARS CORONAVIRUS 2 (TAT 6-24 HRS): SARS Coronavirus 2: NEGATIVE

## 2021-02-11 IMAGING — CT CT ABD-PELV W/ CM
2 of 5 series · 16 of 46 positions shown, 18 images · IV contrast (omnipaque)
Comparison: No priors.

CLINICAL DATA: 23-year-old female with history of abdominal pain
and fever. Postoperative nausea and vomiting. History of
cholecystectomy less [REDACTED].

EXAM:
CT ABDOMEN AND PELVIS WITH CONTRAST
TECHNIQUE: Multidetector CT imaging of the abdomen and pelvis was performed
using the standard protocol following bolus administration of
intravenous contrast.
CONTRAST:  100mL OMNIPAQUE IOHEXOL 300 MG/ML  SOLN

[Series 2: axial st · axial · 0.60mm/px · z∈[-476,-100]mm · 13 of 87 slices shown, 15 images]
[im 6/87  soft-tissue]
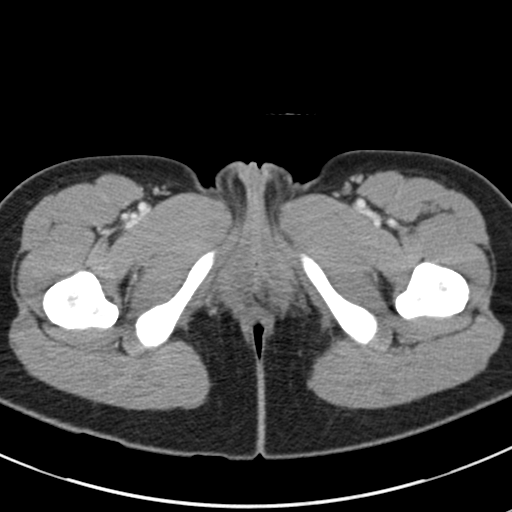
[im 6/87  bone]
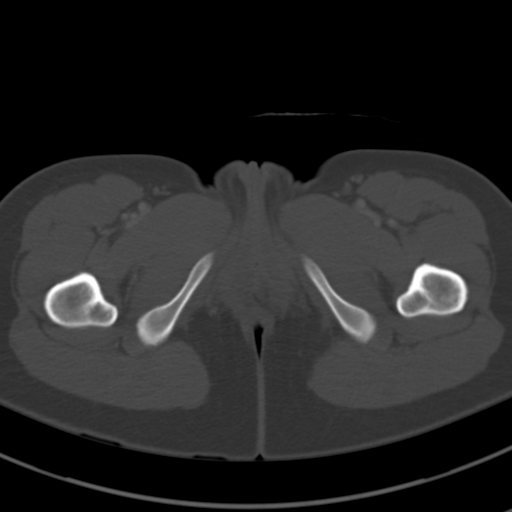
[im 11/87  soft-tissue]
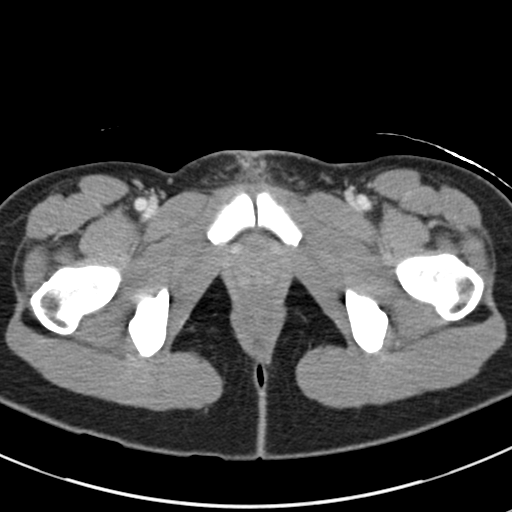
[im 17/87  soft-tissue]
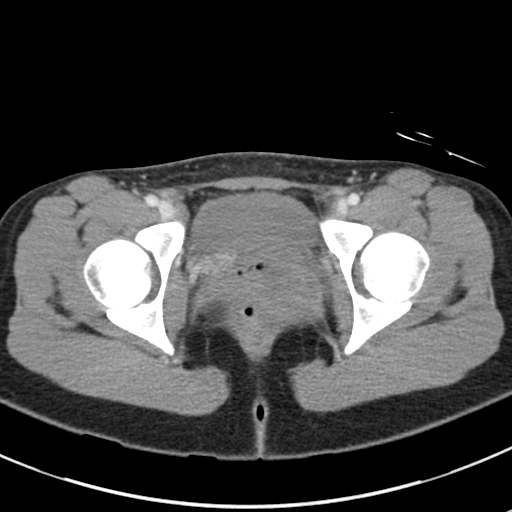
[im 27/87  soft-tissue]
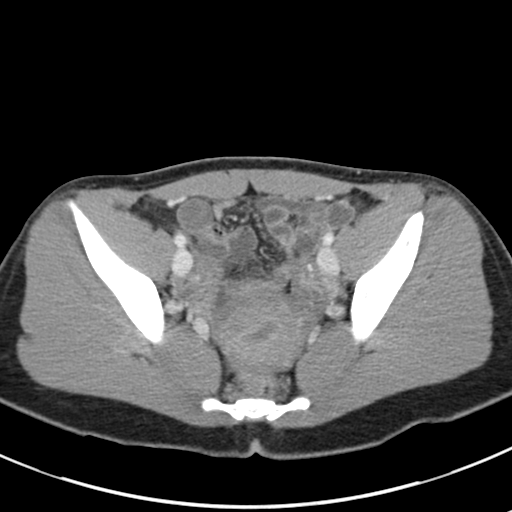
[im 33/87  soft-tissue]
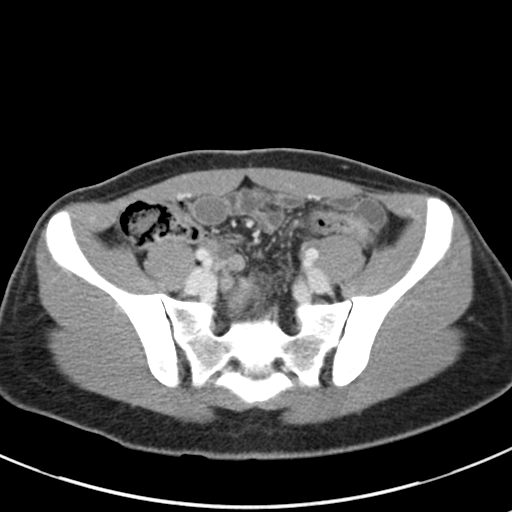
[im 38/87  soft-tissue]
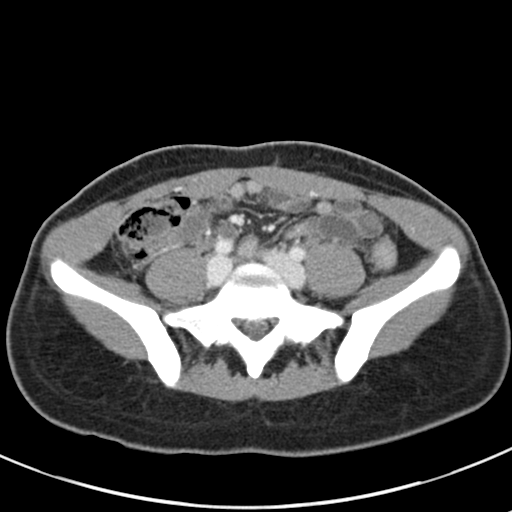
[im 44/87  soft-tissue]
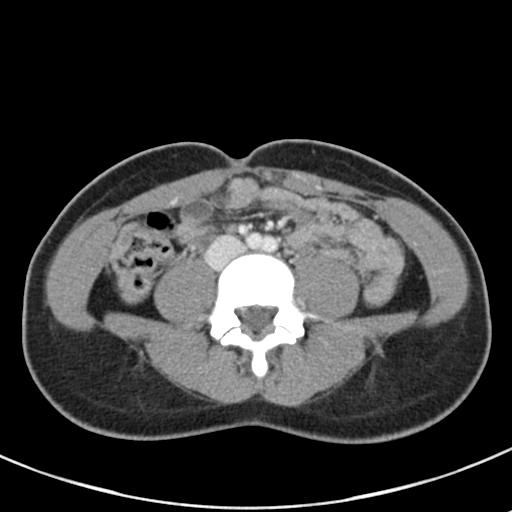
[im 49/87  soft-tissue]
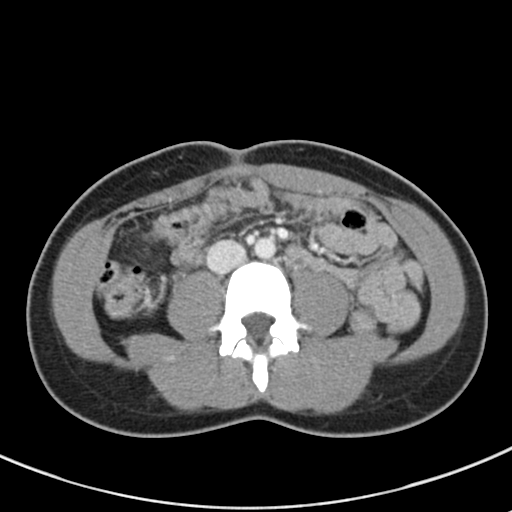
[im 54/87  soft-tissue]
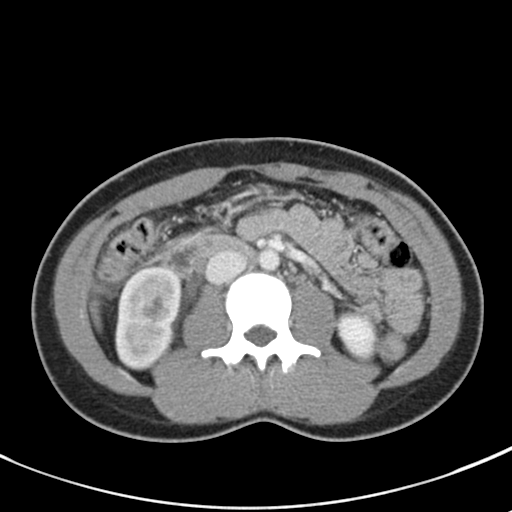
[im 54/87  bone]
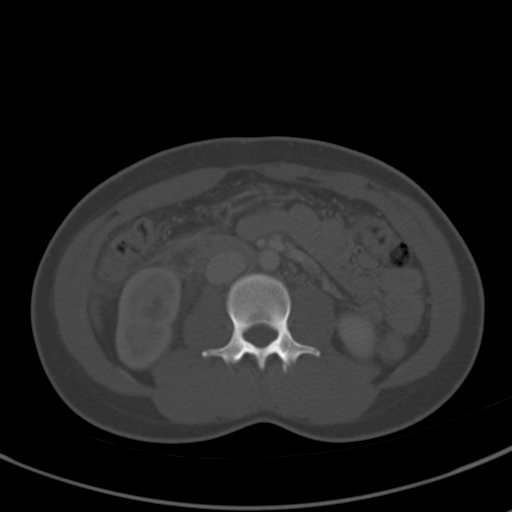
[im 60/87  soft-tissue]
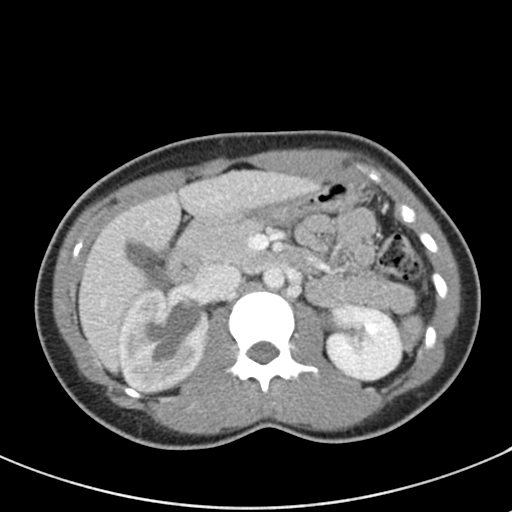
[im 70/87  soft-tissue]
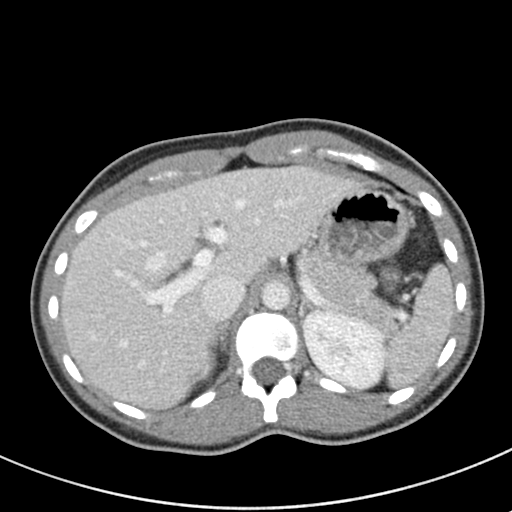
[im 76/87  soft-tissue]
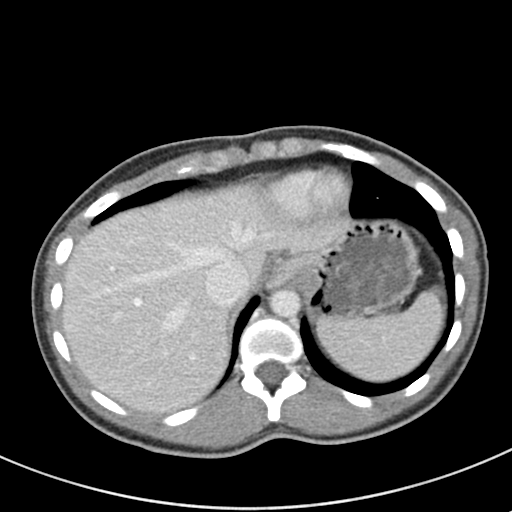
[im 81/87  soft-tissue]
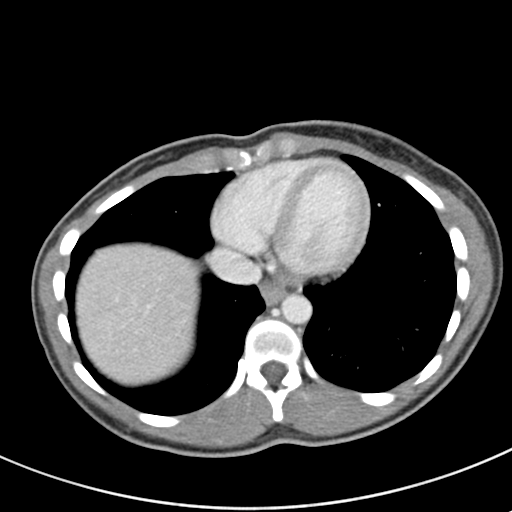

[Series 4: coronal st · coronal · 0.73mm/px · 3 of 108 slices shown]
[im 36/108  soft-tissue]
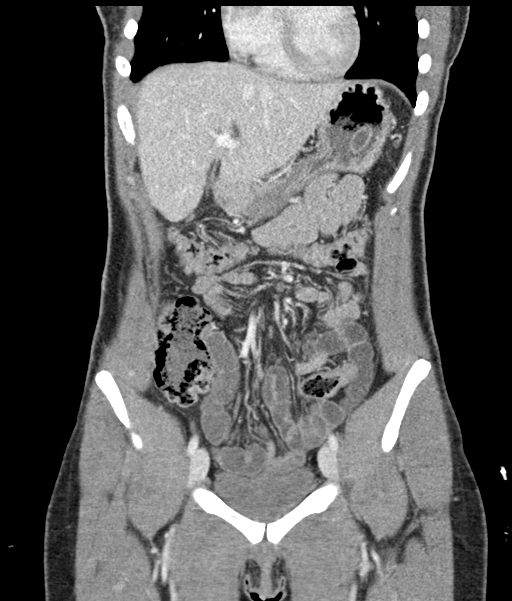
[im 48/108  soft-tissue]
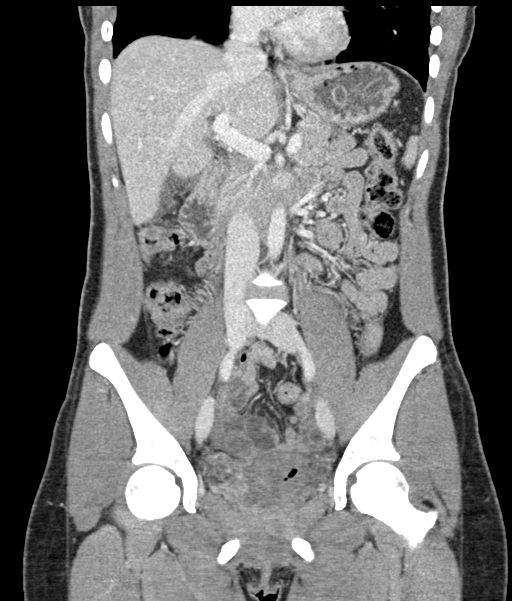
[im 60/108  soft-tissue]
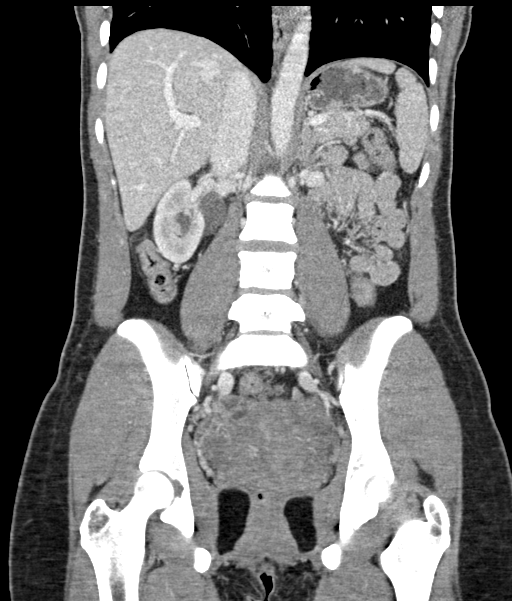

[16 of 46 positions shown; findings below may reference images not displayed]

FINDINGS: Lower chest: Small hiatal hernia.

Hepatobiliary: No suspicious cystic or solid hepatic lesions. No
intra or extrahepatic biliary ductal dilatation. Small amount of
fluid and soft tissue stranding in the gallbladder fossa related to
recent cholecystectomy.

Pancreas: No pancreatic mass. No pancreatic ductal dilatation. No
pancreatic or peripancreatic fluid collections or inflammatory
changes.

Spleen: Unremarkable.

Adrenals/Urinary Tract: Moderate right-sided hydroureteronephrosis
and delayed right nephrogram. Left kidney is normal in appearance.
No left hydroureteronephrosis. At the right ureterovesicular
junction (axial image 69 of series 2) there is a 5 mm obstructive
calculus. Urinary bladder is nearly decompressed, but otherwise
unremarkable in appearance. Bilateral adrenal glands are normal in
appearance.

Stomach/Bowel: Normal appearance of the stomach. No pathologic
dilatation of small bowel or colon. Normal appendix.

Vascular/Lymphatic: No significant atherosclerotic disease, aneurysm
or dissection noted in the abdominal or pelvic vasculature. No
lymphadenopathy noted in the abdomen or pelvis.

Reproductive: Uterus and ovaries are unremarkable in appearance.

Other: No significant volume of ascites.  No pneumoperitoneum.

Musculoskeletal: There are no aggressive appearing lytic or blastic
lesions noted in the visualized portions of the skeleton.
IMPRESSION: 1. 5 mm obstructive calculus at the right ureterovesicular junction
with moderate proximal right hydroureteronephrosis.
2. Trace amount of fluid and soft tissue stranding in the
gallbladder fossa related to recent cholecystectomy. This is an
expected postoperative appearance.
3. Small hiatal hernia.

## 2021-03-26 ENCOUNTER — Ambulatory Visit (HOSPITAL_COMMUNITY): Payer: Self-pay

## 2021-04-24 ENCOUNTER — Ambulatory Visit (HOSPITAL_COMMUNITY)
Admission: EM | Admit: 2021-04-24 | Discharge: 2021-04-24 | Disposition: A | Discharge status: Home / Self Care | Payer: Medicaid Other | Attending: Family Medicine | Admitting: Family Medicine

## 2021-04-24 ENCOUNTER — Encounter (HOSPITAL_COMMUNITY): Payer: Self-pay | Admitting: Emergency Medicine

## 2021-04-24 ENCOUNTER — Other Ambulatory Visit: Payer: Self-pay

## 2021-04-24 DIAGNOSIS — R8281 Pyuria: Secondary | ICD-10-CM | POA: Insufficient documentation

## 2021-04-24 DIAGNOSIS — R350 Frequency of micturition: Secondary | ICD-10-CM | POA: Insufficient documentation

## 2021-04-24 DIAGNOSIS — Z20822 Contact with and (suspected) exposure to covid-19: Secondary | ICD-10-CM | POA: Insufficient documentation

## 2021-04-24 LAB — POCT URINALYSIS DIPSTICK, ED / UC
Glucose, UA: NEGATIVE mg/dL
Ketones, ur: >=160 mg/dL — AB
Nitrite: NEGATIVE
Protein, ur: 30 mg/dL — AB
Specific Gravity, Urine: 1.02 (ref 1.005–1.030)
Urobilinogen, UA: 4 mg/dL — ABNORMAL HIGH (ref 0.0–1.0)
pH: 6.5 (ref 5.0–8.0)

## 2021-04-24 LAB — SARS CORONAVIRUS 2 (TAT 6-24 HRS): SARS Coronavirus 2: NEGATIVE

## 2021-04-24 LAB — POC URINE PREG, ED: Preg Test, Ur: NEGATIVE

## 2021-04-24 NOTE — ED Provider Notes (Signed)
?[facility] ? ? ? ?CSN: [id number] ?Arrival date & time: 04/24/21  0830 ? ? ?  ? ?History   ?Chief Complaint ?Chief Complaint  ?Patient presents with  ? Urinary Frequency  ? ? ?HPI ?Robin Craig is a 26 y.o. female.  ? ? ?Urinary Frequency ? ?Here for urinary frequency for about 1 week.  Yesterday she began having some cough mild sore throat, and congestion.  Her temperature here is 100.4.  She is not really aching much. ? ?No dysuria or abdominal pain.  Last menstrual period was about 2 to 3 weeks ago ? ?Due to recent exposure she is concerned that she might be pregnant. ? ?Past Medical History:  ?Diagnosis Date  ? Chlamydia   ? Chronic kidney disease   ? kidney stones  ? GC (gonococcus infection)   ? UTI (urinary tract infection)   ? Yeast vaginitis   ? ? ?Patient Active Problem List  ? Diagnosis Date Noted  ? Vaginal delivery 08/10/2016  ? History of kidney stones--early pregnancy 07/05/2016  ? ? ?Past Surgical History:  ?Procedure Laterality Date  ? CHOLECYSTECTOMY  2021  ? NO PAST SURGERIES    ? WISDOM TOOTH EXTRACTION  2010  ? ? ?OB History   ? ? Gravida  ?4  ? Para  ?2  ? Term  ?2  ? Preterm  ?   ? AB  ?1  ? Living  ?2  ?  ? ? SAB  ?1  ? IAB  ?   ? Ectopic  ?   ? Multiple  ?0  ? Live Births  ?2  ?   ?  ?  ? ? ? ?Home Medications   ? ?Prior to Admission medications   ?Medication Sig Start Date End Date Taking? Authorizing Provider  ?azelastine (ASTELIN) 0.1 % nasal spray Place 2 sprays into both nostrils 2 (two) times daily. Use in each nostril as directed 01/19/21   Cathlyn Parsons, NP  ?fluticasone (FLONASE) 50 MCG/ACT nasal spray Place 2 sprays into both nostrils daily. 01/19/21   Cathlyn Parsons, NP  ?Norethin Ace-Eth Estrad-FE 1-20 MG-MCG(24) TABS Take 1 tablet by mouth daily. 06/13/10 04/17/11  Ta, Cat, MD  ? ? ?Family History ?Family History  ?Problem Relation Age of Onset  ? Healthy Father   ? Asthma Sister   ? Cancer Neg Hx   ? Diabetes Neg Hx   ? Hypertension Neg Hx   ? ? ?Social  History ?Social History  ? ?Tobacco Use  ? Smoking status: Never  ? Smokeless tobacco: Never  ?Vaping Use  ? Vaping Use: Never used  ?Substance Use Topics  ? Alcohol use: No  ? Drug use: No  ? ? ? ?Allergies   ?Flagyl [metronidazole] and Latex ? ? ?Review of Systems ?Review of Systems  ?Genitourinary:  Positive for frequency.  ? ? ?Physical Exam ?Triage Vital Signs ?ED Triage Vitals  ?Enc Vitals Group  ?   BP 04/24/21 0910 131/79  ?   Pulse Rate 04/24/21 0910 (!) 103  ?   Resp 04/24/21 0910 16  ?   Temp 04/24/21 0910 (!) 100.4 ?F (38 ?C)  ?   Temp Source 04/24/21 0910 Oral  ?   SpO2 04/24/21 0910 97 %  ?   Weight 04/24/21 0908 132 lb 7.9 oz (60.1 kg)  ?   Height 04/24/21 0908 5\' 4"  (1.626 m)  ?   Head Circumference --   ?   Peak Flow --   ?  Pain Score 04/24/21 0908 0  ?   Pain Loc --   ?   Pain Edu? --   ?   Excl. in GC? --   ? ?No data found. ? ?Updated Vital Signs ?BP 131/79 (BP Location: Right Arm)   Pulse (!) 103   Temp (!) 100.4 ?F (38 ?C) (Oral)   Resp 16   Ht 5\' 4"  (1.626 m)   Wt 60.1 kg   LMP 04/08/2021 (Approximate)   SpO2 97%   BMI 22.74 kg/m?  ? ?Visual Acuity ?Right Eye Distance:   ?Left Eye Distance:   ?Bilateral Distance:   ? ?Right Eye Near:   ?Left Eye Near:    ?Bilateral Near:    ? ?Physical Exam ?Vitals reviewed.  ?Constitutional:   ?   General: She is not in acute distress. ?   Appearance: She is not toxic-appearing.  ?HENT:  ?   Right Ear: Tympanic membrane and ear canal normal.  ?   Left Ear: Tympanic membrane and ear canal normal.  ?   Nose: Nose normal.  ?   Mouth/Throat:  ?   Mouth: Mucous membranes are moist.  ?   Pharynx: No oropharyngeal exudate or posterior oropharyngeal erythema.  ?Eyes:  ?   Extraocular Movements: Extraocular movements intact.  ?   Conjunctiva/sclera: Conjunctivae normal.  ?   Pupils: Pupils are equal, round, and reactive to light.  ?Cardiovascular:  ?   Rate and Rhythm: Normal rate and regular rhythm.  ?   Heart sounds: No murmur heard. ?Pulmonary:  ?    Effort: Pulmonary effort is normal. No respiratory distress.  ?   Breath sounds: No wheezing, rhonchi or rales.  ?Abdominal:  ?   General: There is no distension.  ?   Palpations: Abdomen is soft.  ?   Tenderness: There is no abdominal tenderness. There is no guarding.  ?Musculoskeletal:  ?   Cervical back: Neck supple.  ?Lymphadenopathy:  ?   Cervical: No cervical adenopathy.  ?Skin: ?   Capillary Refill: Capillary refill takes less than 2 seconds.  ?   Coloration: Skin is not jaundiced or pale.  ?Neurological:  ?   General: No focal deficit present.  ?   Mental Status: She is alert and oriented to person, place, and time.  ?Psychiatric:     ?   Behavior: Behavior normal.  ? ? ? ?UC Treatments / Results  ?Labs ?(all labs ordered are listed, but only abnormal results are displayed) ?Labs Reviewed  ?POCT URINALYSIS DIPSTICK, ED / UC - Abnormal; Notable for the following components:  ?    Result Value  ? Bilirubin Urine MODERATE (*)   ? Ketones, ur >=160 (*)   ? Hgb urine dipstick TRACE (*)   ? Protein, ur 30 (*)   ? Urobilinogen, UA 4.0 (*)   ? Leukocytes,Ua SMALL (*)   ? All other components within normal limits  ?POC URINE PREG, ED  ? ? ?EKG ? ? ?Radiology ?No results found. ? ?Procedures ?Procedures (including critical care time) ? ?Medications Ordered in UC ?Medications - No data to display ? ?Initial Impression / Assessment and Plan / UC Course  ?I have reviewed the triage vital signs and the nursing notes. ? ?Pertinent labs & imaging results that were available during my care of the patient were reviewed by me and considered in my medical decision making (see chart for details). ? ?  ? ?Her UPT is negative.  This shows a moderate amount of  blood and bilirubin and small amount of leukocytes.  She also has some ketones in her urine.  No sugar. ? ?We will swab for COVID so she knows if she needs to quarantine.  We will treat the self swab per protocol for any positives.  Request is made to help her find a primary  care office ?Final Clinical Impressions(s) / UC Diagnoses  ? ?Final diagnoses:  ?Urinary frequency  ?Pyuria  ? ? ? ?Discharge Instructions   ? ?  ?Your urine pregnancy test was negative ? ?Your urinalysis showed some white blood cells and red blood cells.  Urine culture is sent to see if there is a bladder infection. ? ?Staff will call you with any positive to get them treated. ? ?You can continue Tylenol or ibuprofen as needed for fever or aches. ? ? ?You have been swabbed for COVID, and the test will result in the next 24 hours. Our staff will call you if positive. If the test is positive, you should quarantine for 5 days.  ? ? ? ? ?ED Prescriptions   ?None ?  ? ?PDMP not reviewed this encounter. ?  ?Zenia ResidesBanister, Alyannah Sanks K, MD ?04/24/21 [phone] ? ?

## 2021-04-24 NOTE — ED Triage Notes (Signed)
Pt reports urinary frequency x 1 week. States she believes she has a UTI.  ?

## 2021-04-24 NOTE — Discharge Instructions (Addendum)
Your urine pregnancy test was negative ? ?Your urinalysis showed some white blood cells and red blood cells.  Urine culture is sent to see if there is a bladder infection. ? ?Staff will call you with any positive to get them treated. ? ?You can continue Tylenol or ibuprofen as needed for fever or aches. ? ? ?You have been swabbed for COVID, and the test will result in the next 24 hours. Our staff will call you if positive. If the test is positive, you should quarantine for 5 days.  ?

## 2021-04-25 LAB — CERVICOVAGINAL ANCILLARY ONLY
Bacterial Vaginitis (gardnerella): NEGATIVE
Candida Glabrata: NEGATIVE
Candida Vaginitis: POSITIVE — AB
Chlamydia: NEGATIVE
Comment: NEGATIVE
Comment: NEGATIVE
Comment: NEGATIVE
Comment: NEGATIVE
Comment: NEGATIVE
Comment: NORMAL
Neisseria Gonorrhea: NEGATIVE
Trichomonas: POSITIVE — AB

## 2021-04-25 LAB — URINE CULTURE

## 2021-04-26 ENCOUNTER — Telehealth (HOSPITAL_COMMUNITY): Payer: Self-pay | Admitting: Emergency Medicine

## 2021-04-26 MED ORDER — FLUCONAZOLE 150 MG PO TABS: 150.0000 mg | ORAL_TABLET | Freq: Once | ORAL | 0 refills | Status: AC

## 2021-04-29 IMAGING — US US OB < 14 WEEKS - US OB TV
1 series · 15 of 28 positions shown · non-contrast
Comparison: None.

CLINICAL DATA: Last menstrual period 11/13/2019. Gestational age by
last menstrual period of 4 weeks and 3 days. Estimated due date by
last menstrual period of 08/19/2020. Patient presenting with heavy
bleeding since last night. No pain. IVF transfer on 12/04/2019. G4P2
SAB 1.

EXAM:
OBSTETRIC <14 WK US AND TRANSVAGINAL OB US
TECHNIQUE: Both transabdominal and transvaginal ultrasound examinations were
performed for complete evaluation of the gestation as well as the
maternal uterus, adnexal regions, and pelvic cul-de-sac.
Transvaginal technique was performed to assess early pregnancy.

[Series 1: us ob < 14 weeks - us ob tv · 59 acquisitions, 15 frames shown]
[im 1/59]
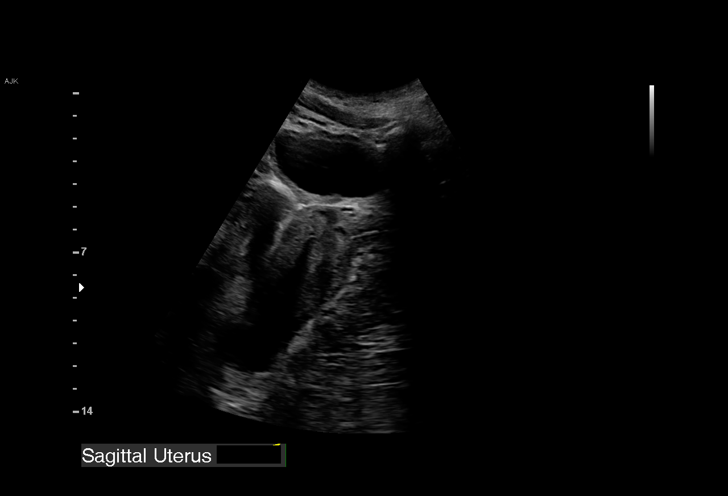
[im 5/59]
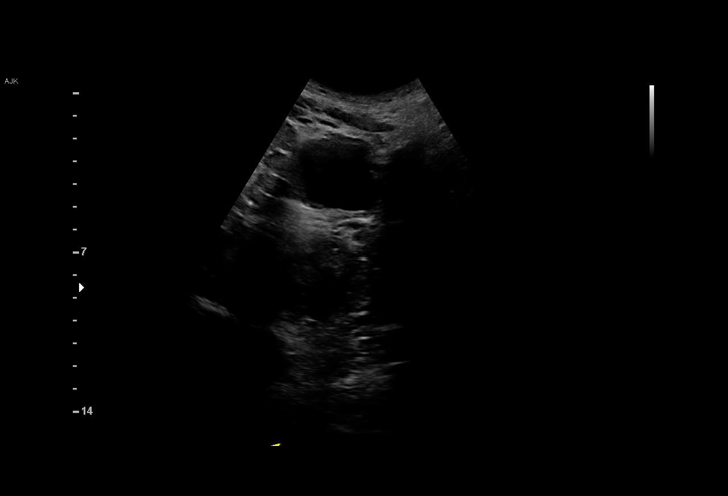
[im 9/59]
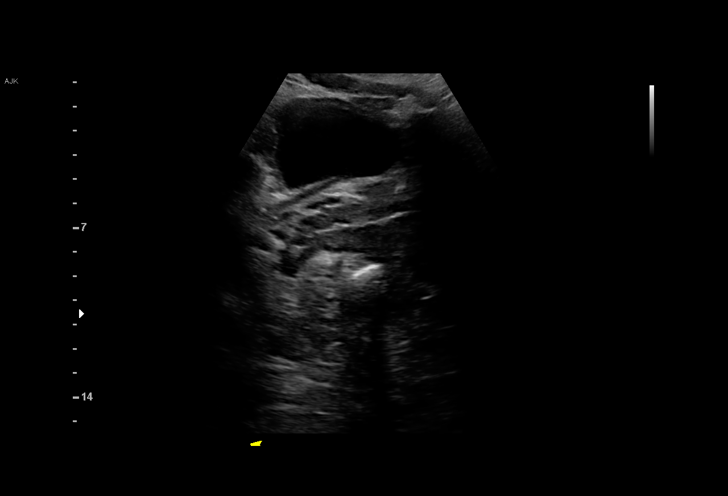
[im 13/59]
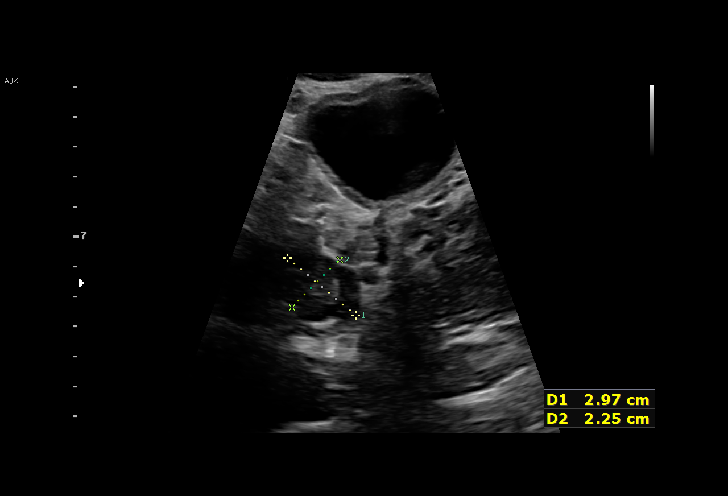
[im 18/59]
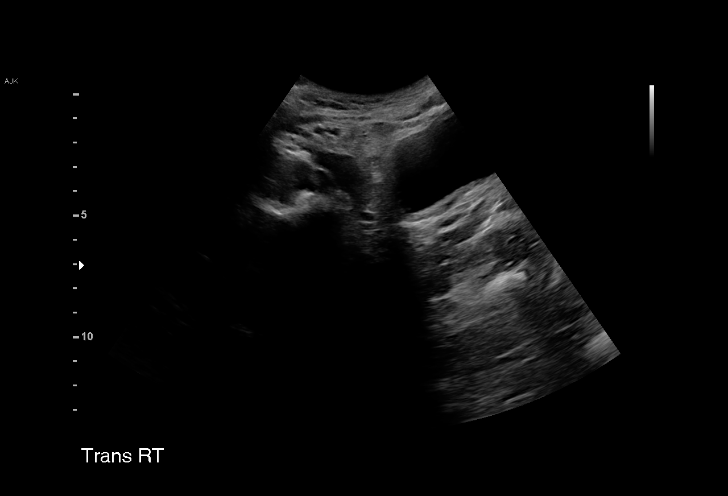
[im 22/59]
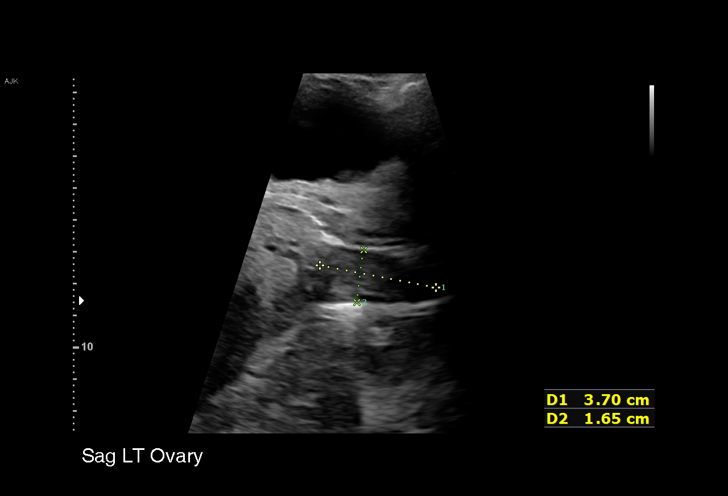
[im 26/59]
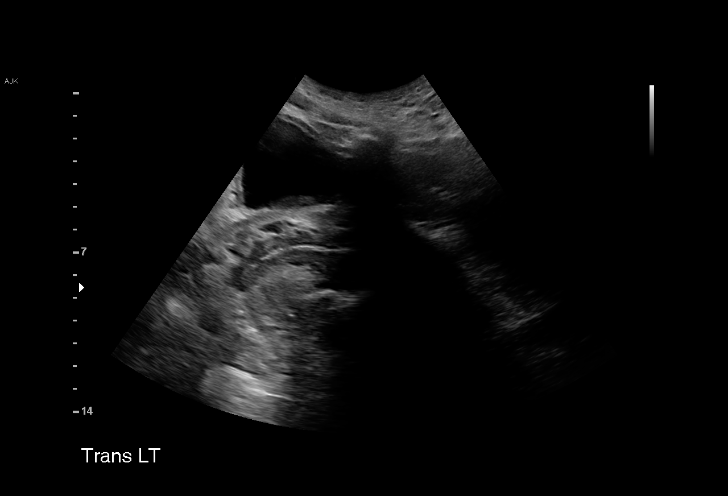
[im 31/59]
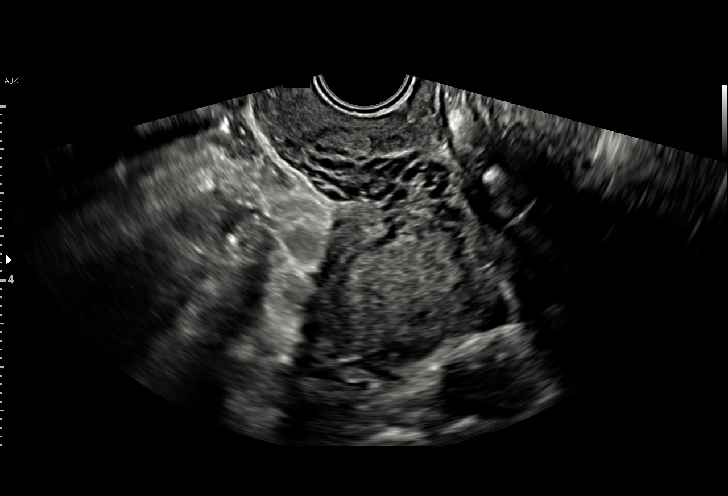
[im 33/59]
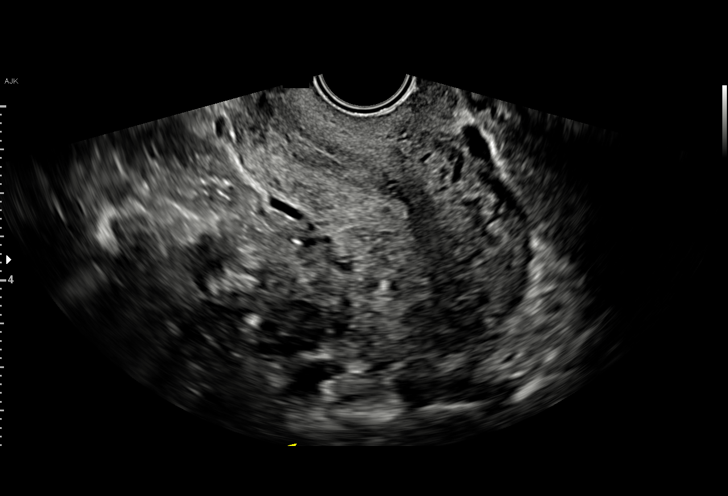
[im 37/59]
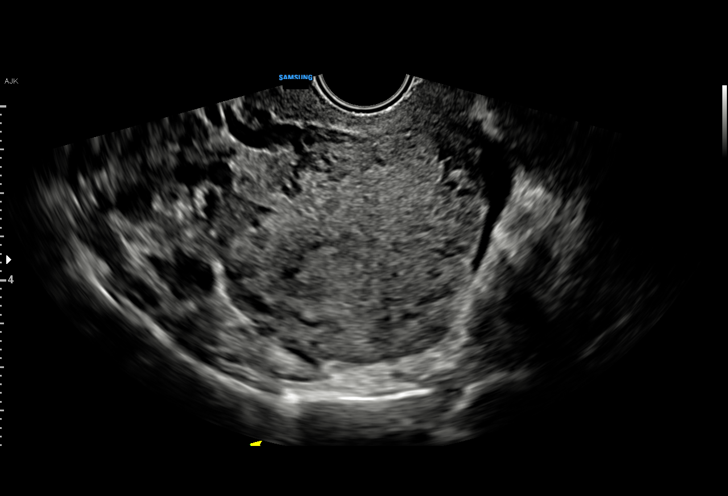
[im 41/59]
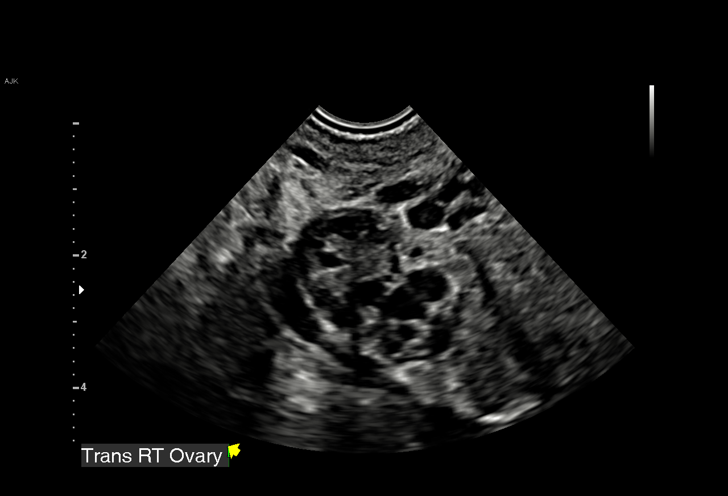
[im 46/59]
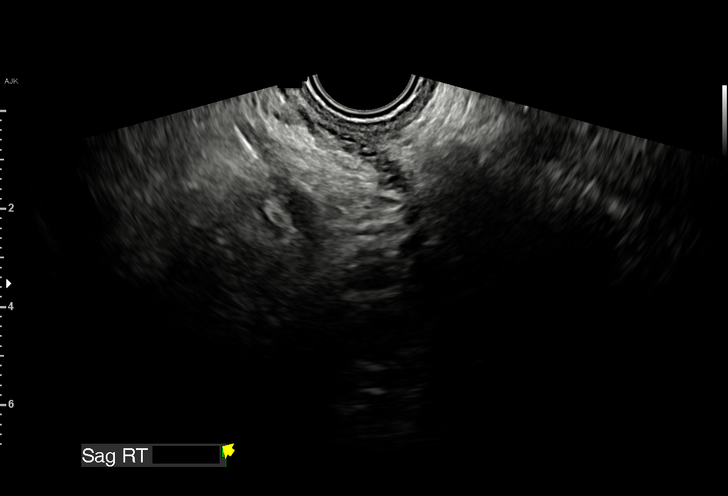
[im 50/59]
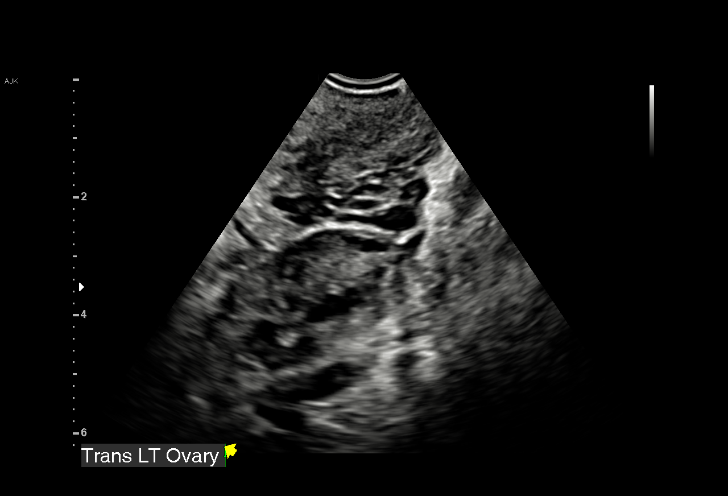
[im 54/59]
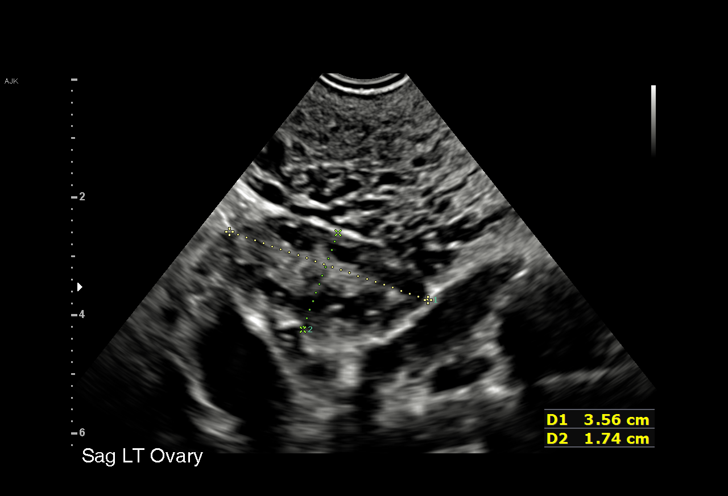
[im 59/59]
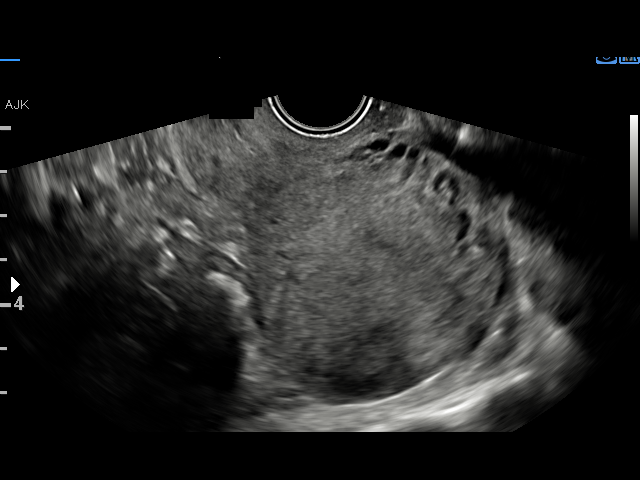

[15 of 28 positions shown; findings below may reference images not displayed]

FINDINGS: Intrauterine gestational sac: None

Yolk sac:  Not Visualized.

Embryo:  Not Visualized.

Cardiac Activity: Not Visualized.

Subchorionic hemorrhage:  None visualized.

Maternal uterus/adnexae: Bilateral ovaries are unremarkable. The
uterus is unremarkable.

Other: No free pelvic fluid.
IMPRESSION: No intrauterine gestational sac, yolk sac, fetal pole, or cardiac
activity yet visualized. Findings could result from a probable early
pregnancy. Recommend follow-up quantitative B-HCG levels and
follow-up US in 14 days to assess viability. This recommendation
follows SRU consensus guidelines: Diagnostic Criteria for Nonviable
Pregnancy Early in the First Trimester. N Engl J Med 9686;

## 2021-05-04 ENCOUNTER — Encounter (HOSPITAL_COMMUNITY): Payer: Self-pay

## 2021-09-27 ENCOUNTER — Ambulatory Visit (HOSPITAL_COMMUNITY)
Admission: EM | Admit: 2021-09-27 | Discharge: 2021-09-27 | Disposition: A | Discharge status: Home / Self Care | Payer: Medicaid Other

## 2021-09-27 ENCOUNTER — Encounter (HOSPITAL_COMMUNITY): Payer: Self-pay | Admitting: *Deleted

## 2021-09-27 NOTE — ED Triage Notes (Signed)
Patient complains of dizziness and nausea. Would like pregnancy test.

## 2022-01-24 ENCOUNTER — Emergency Department (HOSPITAL_BASED_OUTPATIENT_CLINIC_OR_DEPARTMENT_OTHER)
Admission: EM | Admit: 2022-01-24 | Discharge: 2022-01-25 | Disposition: A | Discharge status: Home / Self Care | Payer: Self-pay | Attending: Emergency Medicine | Admitting: Emergency Medicine

## 2022-01-24 ENCOUNTER — Other Ambulatory Visit: Payer: Self-pay

## 2022-01-24 ENCOUNTER — Encounter (HOSPITAL_BASED_OUTPATIENT_CLINIC_OR_DEPARTMENT_OTHER): Payer: Self-pay

## 2022-01-24 DIAGNOSIS — A599 Trichomoniasis, unspecified: Secondary | ICD-10-CM | POA: Insufficient documentation

## 2022-01-24 LAB — URINALYSIS, ROUTINE W REFLEX MICROSCOPIC
Bilirubin Urine: NEGATIVE
Glucose, UA: NEGATIVE mg/dL
Hgb urine dipstick: NEGATIVE
Ketones, ur: NEGATIVE mg/dL
Nitrite: NEGATIVE
Protein, ur: NEGATIVE mg/dL
Specific Gravity, Urine: 1.005 (ref 1.005–1.030)
pH: 7 (ref 5.0–8.0)

## 2022-01-24 LAB — WET PREP, GENITAL
Clue Cells Wet Prep HPF POC: NONE SEEN
Sperm: NONE SEEN
WBC, Wet Prep HPF POC: <10 (ref <10)
Yeast Wet Prep HPF POC: NONE SEEN

## 2022-01-24 LAB — PREGNANCY, URINE: Preg Test, Ur: NEGATIVE

## 2022-01-24 NOTE — ED Triage Notes (Signed)
Patient here POV from Home.  Endorses recent Sexual Encounters (2) this past week. White Discharge and Irritation for 4 Days. No Dysuria.   NAD Noted during Triage. A&Ox4. GCS 15. Ambulatory.

## 2022-01-25 MED ORDER — LORATADINE 10 MG PO TABS
10.0000 mg | ORAL_TABLET | Freq: Once | ORAL | Status: AC
Start: 2022-01-25 — End: 2022-01-25
  Administered 2022-01-25: 10 mg via ORAL
  Filled 2022-01-25 (×2): qty 1

## 2022-01-25 MED ORDER — METRONIDAZOLE 500 MG PO TABS: 500.0000 mg | ORAL_TABLET | Freq: Two times a day (BID) | ORAL | 0 refills | Status: DC

## 2022-01-25 MED ORDER — CEFTRIAXONE SODIUM 500 MG IJ SOLR
500.0000 mg | Freq: Once | INTRAMUSCULAR | Status: AC
  Administered 2022-01-25: 500 mg via INTRAMUSCULAR
  Filled 2022-01-25: qty 500

## 2022-01-25 MED ORDER — AZITHROMYCIN 1 G PO PACK
1.0000 g | PACK | Freq: Once | ORAL | Status: AC
  Administered 2022-01-25: 1 g via ORAL
  Filled 2022-01-25: qty 1

## 2022-01-25 MED ORDER — METRONIDAZOLE 500 MG PO TABS: 500.0000 mg | ORAL_TABLET | Freq: Two times a day (BID) | ORAL | 0 refills | Status: AC

## 2022-01-25 MED ORDER — LORATADINE 10 MG PO TABS
10.0000 mg | ORAL_TABLET | Freq: Every day | ORAL | 0 refills | Status: AC
Start: 2022-01-25 — End: ?

## 2022-01-25 NOTE — ED Provider Notes (Addendum)
[facility] EMERGENCY DEPT Provider Note   CSN: [id number] Arrival date & time: 01/24/22  2036     History  Chief Complaint  Patient presents with   Vaginal Discharge    Robin Craig is a 26 y.o. female.  The history is provided by the patient.  Vaginal Discharge Quality:  White Severity:  Moderate Onset quality:  Gradual Timing:  Constant Progression:  Unchanged Chronicity:  New Context: spontaneously   Relieved by:  Nothing Worsened by:  Nothing Associated symptoms: vaginal itching   Associated symptoms: no fever   Risk factors: no immunosuppression        Home Medications Prior to Admission medications   Medication Sig Start Date End Date Taking? Authorizing Provider  loratadine (CLARITIN) 10 MG tablet Take 1 tablet (10 mg total) by mouth daily. 01/25/22  Yes Silvester Reierson, MD  metroNIDAZOLE (FLAGYL) 500 MG tablet Take 1 tablet (500 mg total) by mouth 2 (two) times daily. One po bid x 7 days 01/25/22  Yes Jalee Saine, MD  azelastine (ASTELIN) 0.1 % nasal spray Place 2 sprays into both nostrils 2 (two) times daily. Use in each nostril as directed 01/19/21   Cathlyn Parsons, NP  fluticasone (FLONASE) 50 MCG/ACT nasal spray Place 2 sprays into both nostrils daily. 01/19/21   Cathlyn Parsons, NP  Norethin Ace-Eth Estrad-FE 1-20 MG-MCG(24) TABS Take 1 tablet by mouth daily. 06/13/10 04/17/11  Ta, Cat, MD      Allergies    Flagyl [metronidazole] and Latex    Review of Systems   Review of Systems  Constitutional:  Negative for fever.  HENT:  Negative for facial swelling.   Genitourinary:  Positive for vaginal discharge.  All other systems reviewed and are negative.   Physical Exam Updated Vital Signs BP (!) 129/108 (BP Location: Right Arm)   Pulse 80   Temp 98.2 F (36.8 C) (Oral)   Resp 18   Ht 5\' 4"  (1.626 m)   Wt 60.1 kg   SpO2 100%   BMI 22.74 kg/m  Physical Exam Vitals and nursing note reviewed.  Constitutional:      Appearance:  Normal appearance.  HENT:     Head: Normocephalic and atraumatic.     Nose: Nose normal.  Eyes:     Conjunctiva/sclera: Conjunctivae normal.     Pupils: Pupils are equal, round, and reactive to light.  Cardiovascular:     Rate and Rhythm: Normal rate and regular rhythm.     Pulses: Normal pulses.     Heart sounds: Normal heart sounds.  Pulmonary:     Effort: Pulmonary effort is normal.     Breath sounds: Normal breath sounds.  Abdominal:     General: Bowel sounds are normal.     Palpations: Abdomen is soft.     Tenderness: There is no abdominal tenderness. There is no guarding.  Musculoskeletal:        General: Normal range of motion.     Cervical back: Normal range of motion and neck supple.  Skin:    General: Skin is warm and dry.     Capillary Refill: Capillary refill takes less than 2 seconds.  Neurological:     General: No focal deficit present.     Mental Status: She is alert and oriented to person, place, and time.     Deep Tendon Reflexes: Reflexes normal.  Psychiatric:        Mood and Affect: Mood normal.        Behavior:  Behavior normal.     ED Results / Procedures / Treatments   Labs (all labs ordered are listed, but only abnormal results are displayed) Labs Reviewed  WET PREP, GENITAL - Abnormal; Notable for the following components:      Result Value   Trich, Wet Prep PRESENT (*)    All other components within normal limits  URINALYSIS, ROUTINE W REFLEX MICROSCOPIC - Abnormal; Notable for the following components:   APPearance HAZY (*)    Leukocytes,Ua TRACE (*)    All other components within normal limits  PREGNANCY, URINE  GC/CHLAMYDIA PROBE AMP ([place]) NOT AT [facility]    EKG None  Radiology No results found.  Procedures Procedures    Medications Ordered in ED Medications  cefTRIAXone (ROCEPHIN) injection 500 mg (500 mg Intramuscular Given 01/25/22 0026)  azithromycin (ZITHROMAX) powder 1 g (1 g Oral Given 01/25/22 0026)    ED Course/  Medical Decision Making/ A&P                           Medical Decision Making Patient with white vaginal dis  Amount and/or Complexity of Data Reviewed Labs: ordered. Discussion of management or test interpretation with external provider(s): Discussed case with pharmacist james who reports patient has tolerated flagyl after the allergy was reported in the EPIC.    Risk OTC drugs. Prescription drug management. Risk Details: Patient reports this medication has caused nausea in the past and she does not remember hives.  I explained she make not drink alcohol while on this medication.  I will start claritin that patient will take daily while on this medication to prevent any reaction even though patient has tolerated this medication after the flagyl was placed in her chart.  I discussed this at length with pharmacist who did a thorough chart review and states the patient has had this medication several times following the flag without difficulty.  First dose of claritin given in the ED.  Take next dose in am wait one hour and then take flagyl on a full stomach.  Treated for gonorrhea and chlamydia in the ED.  RX for flagyl.  Please inform all partners that your were treated.  All partners may be [facility].  No sexual activity of any kind until 7 days after all partners treated.  Patient verbalizes understanding and agrees to follow up.      Final Clinical Impression(s) / ED Diagnoses Final diagnoses:  Trichomonas infection   Return for intractable cough, coughing up blood, fevers > 100.4 unrelieved by medication, shortness of breath, intractable vomiting, chest pain, shortness of breath, weakness, numbness, changes in speech, facial asymmetry, abdominal pain, passing out, Inability to tolerate liquids or food, cough, altered mental status or any concerns. No signs of systemic illness or infection. The patient is nontoxic-appearing on exam and vital signs are within  normal limits.  I have reviewed the triage vital signs and the nursing notes. Pertinent labs & imaging results that were available during my care of the patient were reviewed by me and considered in my medical decision making (see chart for details). After history, exam, and medical workup I feel the patient has been appropriately medically screened and is safe for discharge home. Pertinent diagnoses were discussed with the patient. Patient was given return precautions.  Rx / DC Orders ED Discharge Orders          Ordered    metroNIDAZOLE (FLAGYL) 500  MG tablet  2 times daily        01/25/22 0013    loratadine (CLARITIN) 10 MG tablet  Daily        01/25/22 0014              Maliik Karner, MD 01/25/22 [id number]    Nicanor Alcon, Aemon Koeller, MD 01/25/22 9450

## 2022-01-28 LAB — GC/CHLAMYDIA PROBE AMP (~~LOC~~) NOT AT ARMC
Chlamydia: NEGATIVE
Comment: NEGATIVE
Comment: NORMAL
Neisseria Gonorrhea: NEGATIVE

## 2022-02-06 ENCOUNTER — Ambulatory Visit (HOSPITAL_COMMUNITY)
Admission: EM | Admit: 2022-02-06 | Discharge: 2022-02-06 | Disposition: A | Discharge status: Home / Self Care | Payer: Self-pay | Attending: Urgent Care | Admitting: Urgent Care

## 2022-02-06 ENCOUNTER — Encounter (HOSPITAL_COMMUNITY): Payer: Self-pay | Admitting: Emergency Medicine

## 2022-02-06 DIAGNOSIS — A599 Trichomoniasis, unspecified: Secondary | ICD-10-CM | POA: Insufficient documentation

## 2022-02-06 LAB — POC URINE PREG, ED: Preg Test, Ur: NEGATIVE

## 2022-02-06 MED ORDER — ONDANSETRON 4 MG PO TBDP
4.0000 mg | ORAL_TABLET | Freq: Three times a day (TID) | ORAL | 0 refills | Status: AC | PRN
Start: 2022-02-06 — End: ?

## 2022-02-06 MED ORDER — ONDANSETRON 4 MG PO TBDP
ORAL_TABLET | ORAL | Status: AC
  Filled 2022-02-06: qty 1

## 2022-02-06 MED ORDER — ONDANSETRON 4 MG PO TBDP
4.0000 mg | ORAL_TABLET | Freq: Once | ORAL | Status: AC
  Administered 2022-02-06: 4 mg via ORAL

## 2022-02-06 MED ORDER — METRONIDAZOLE 500 MG PO TABS
500.0000 mg | ORAL_TABLET | Freq: Two times a day (BID) | ORAL | 0 refills | Status: AC
Start: 2022-02-06 — End: ?

## 2022-02-06 NOTE — ED Triage Notes (Signed)
Pt reports has vaginal discharge that is brown and vaginal itching since Sunday. Has the one partner

## 2022-02-06 NOTE — Discharge Instructions (Addendum)
Please start taking Flagyl twice daily x 7 days. It is imperative that your partner is also treated, otherwise you will continue to get recurrent symptoms. This is considered a sexually transmitted infection, do not resume intercourse until he has completed his treatment as well. Use the Zofran to help offset side effects of the Flagyl. Please stop the Claritin.

## 2022-02-06 NOTE — ED Provider Notes (Signed)
[facility]    CSN: [id number] Arrival date & time: 02/06/22  0841      History   Chief Complaint Chief Complaint  Patient presents with   Vaginal Discharge    HPI Robin Craig is a 26 y.o. female.   26 year old female presents today due to concerns of vaginal itching and irritation.  She was seen in the emergency room on the 14th, and diagnosed with trichomonas.  She was prescribed Flagyl.  She states she never started taking the Flagyl because she "does not like pills".  She was also given a prescription of Claritin to offset any possible allergic reaction she might have with this.  Patient has been taking the Claritin since and states it has not helped with her vaginal itching or irritation.  She has 1 single partner, but states he is in the [employer] and "every time I have sex with him I get something".  She denies any dysuria, hematuria, pelvic pain, flank pain, fever.  She denies any rash or lesions.   Vaginal Discharge   Past Medical History:  Diagnosis Date   Chlamydia    Chronic kidney disease    kidney stones   GC (gonococcus infection)    UTI (urinary tract infection)    Yeast vaginitis     Patient Active Problem List   Diagnosis Date Noted   Vaginal delivery 08/10/2016   History of kidney stones--early pregnancy 07/05/2016    Past Surgical History:  Procedure Laterality Date   CHOLECYSTECTOMY  2021   NO PAST SURGERIES     WISDOM TOOTH EXTRACTION  2010    OB History     Gravida  4   Para  2   Term  2   Preterm      AB  1   Living  2      SAB  1   IAB      Ectopic      Multiple  0   Live Births  2            Home Medications    Prior to Admission medications   Medication Sig Start Date End Date Taking? Authorizing Provider  metroNIDAZOLE (FLAGYL) 500 MG tablet Take 1 tablet (500 mg total) by mouth 2 (two) times daily. 02/06/22  Yes Mykale Gandolfo L, PA  azelastine (ASTELIN) 0.1 % nasal spray Place 2 sprays  into both nostrils 2 (two) times daily. Use in each nostril as directed 01/19/21   Cathlyn Parsons, NP  fluticasone (FLONASE) 50 MCG/ACT nasal spray Place 2 sprays into both nostrils daily. 01/19/21   Cathlyn Parsons, NP  loratadine (CLARITIN) 10 MG tablet Take 1 tablet (10 mg total) by mouth daily. 01/25/22   Palumbo, April, MD  metroNIDAZOLE (FLAGYL) 500 MG tablet Take 1 tablet (500 mg total) by mouth 2 (two) times daily. One po bid x 7 days 01/25/22   Palumbo, April, MD  ondansetron (ZOFRAN-ODT) 4 MG disintegrating tablet Take 1 tablet (4 mg total) by mouth every 8 (eight) hours as needed for nausea or vomiting. 02/06/22  Yes Kloie Whiting L, PA  Norethin Ace-Eth Estrad-FE 1-20 MG-MCG(24) TABS Take 1 tablet by mouth daily. 06/13/10 04/17/11  Ta, Cat, MD    Family History Family History  Problem Relation Age of Onset   Healthy Father    Asthma Sister    Cancer Neg Hx    Diabetes Neg Hx    Hypertension Neg Hx     Social History  Social History   Tobacco Use   Smoking status: Never   Smokeless tobacco: Never  Vaping Use   Vaping Use: Never used  Substance Use Topics   Alcohol use: No   Drug use: No     Allergies   Flagyl [metronidazole] and Latex   Review of Systems Review of Systems  Genitourinary:  Positive for vaginal discharge.     Physical Exam Triage Vital Signs ED Triage Vitals  Enc Vitals Group     BP 02/06/22 1102 129/89     Pulse Rate 02/06/22 1102 72     Resp 02/06/22 1102 16     Temp 02/06/22 1102 98.7 F (37.1 C)     Temp Source 02/06/22 1102 Oral     SpO2 02/06/22 1102 99 %     Weight --      Height --      Head Circumference --      Peak Flow --      Pain Score 02/06/22 1100 0     Pain Loc --      Pain Edu? --      Excl. in GC? --    No data found.  Updated Vital Signs BP 129/89 (BP Location: Left Arm)   Pulse 72   Temp 98.7 F (37.1 C) (Oral)   Resp 16   LMP 01/13/2022 (Approximate)   SpO2 99%   Visual Acuity Right Eye Distance:    Left Eye Distance:   Bilateral Distance:    Right Eye Near:   Left Eye Near:    Bilateral Near:     Physical Exam Vitals and nursing note reviewed.  Constitutional:      General: She is not in acute distress.    Appearance: She is well-developed.  HENT:     Head: Normocephalic and atraumatic.  Eyes:     Conjunctiva/sclera: Conjunctivae normal.  Cardiovascular:     Rate and Rhythm: Normal rate and regular rhythm.     Heart sounds: No murmur heard. Pulmonary:     Effort: Pulmonary effort is normal. No respiratory distress.     Breath sounds: Normal breath sounds.  Abdominal:     Palpations: Abdomen is soft.     Tenderness: There is no abdominal tenderness.  Genitourinary:    Comments: deferred Musculoskeletal:        General: No swelling.     Cervical back: Neck supple.  Skin:    General: Skin is warm and dry.     Capillary Refill: Capillary refill takes less than 2 seconds.  Neurological:     Mental Status: She is alert.  Psychiatric:        Mood and Affect: Mood normal.      UC Treatments / Results  Labs (all labs ordered are listed, but only abnormal results are displayed) Labs Reviewed  POC URINE PREG, ED  CERVICOVAGINAL ANCILLARY ONLY    EKG   Radiology No results found.  Procedures Procedures (including critical care time)  Medications Ordered in UC Medications  ondansetron (ZOFRAN-ODT) disintegrating tablet 4 mg (4 mg Oral Given 02/06/22 1125)    Initial Impression / Assessment and Plan / UC Course  I have reviewed the triage vital signs and the nursing notes.  Pertinent labs & imaging results that were available during my care of the patient were reviewed by me and considered in my medical decision making (see chart for details).     Trichomonas -Aptima swab collected again, however patient already diagnosed with  trichomonas earlier this month and never started the treatment.  Additionally, her partner was never treated or tested either.   Will therefore reiterate that patient take her Flagyl twice daily.  She states her only reaction is GI symptoms, therefore we gave her a Zofran in clinic for her to tolerate her Flagyl.  She was watched taking her first dose.  Encouraged patient to have partner treated as well.  Do not resume intercourse until both parties have completed treatment.  Stop taking the Claritin as this only treats allergies.  Final Clinical Impressions(s) / UC Diagnoses   Final diagnoses:  Trichomoniasis     Discharge Instructions      Please start taking Flagyl twice daily x 7 days. It is imperative that your partner is also treated, otherwise you will continue to get recurrent symptoms. This is considered a sexually transmitted infection, do not resume intercourse until he has completed his treatment as well. Use the Zofran to help offset side effects of the Flagyl. Please stop the Claritin.     ED Prescriptions     Medication Sig Dispense Auth. Provider   ondansetron (ZOFRAN-ODT) 4 MG disintegrating tablet Take 1 tablet (4 mg total) by mouth every 8 (eight) hours as needed for nausea or vomiting. 20 tablet Shelden Raborn L, PA   metroNIDAZOLE (FLAGYL) 500 MG tablet Take 1 tablet (500 mg total) by mouth 2 (two) times daily. 14 tablet Kalyn Hofstra L, [place]      PDMP not reviewed this encounter.   Maretta Bees, [place] 02/06/22 1134

## 2022-02-07 LAB — CERVICOVAGINAL ANCILLARY ONLY
Bacterial Vaginitis (gardnerella): POSITIVE — AB
Candida Glabrata: NEGATIVE
Candida Vaginitis: POSITIVE — AB
Chlamydia: NEGATIVE
Comment: NEGATIVE
Comment: NEGATIVE
Comment: NEGATIVE
Comment: NEGATIVE
Comment: NEGATIVE
Comment: NORMAL
Neisseria Gonorrhea: NEGATIVE
Trichomonas: POSITIVE — AB

## 2022-02-08 ENCOUNTER — Telehealth (HOSPITAL_COMMUNITY): Payer: Self-pay | Admitting: Emergency Medicine

## 2022-02-08 MED ORDER — FLUCONAZOLE 150 MG PO TABS: 150.0000 mg | ORAL_TABLET | Freq: Once | ORAL | 0 refills | Status: AC

## 2022-03-22 ENCOUNTER — Telehealth (HOSPITAL_COMMUNITY): Payer: Self-pay | Admitting: Emergency Medicine

## 2022-03-22 NOTE — Telephone Encounter (Signed)
Letter that was sent about recent lab results was returned as undeliverable
